# Patient Record
Sex: Female | Born: 1975
Health system: Southern US, Community
[De-identification: ages and names within clinical notes are randomized; demographics above are authoritative.]

## PROBLEM LIST (undated history)

## (undated) DIAGNOSIS — F603 Borderline personality disorder: Secondary | ICD-10-CM

## (undated) DIAGNOSIS — F410 Panic disorder [episodic paroxysmal anxiety] without agoraphobia: Secondary | ICD-10-CM

## (undated) DIAGNOSIS — F4310 Post-traumatic stress disorder, unspecified: Secondary | ICD-10-CM

## (undated) DIAGNOSIS — G43909 Migraine, unspecified, not intractable, without status migrainosus: Secondary | ICD-10-CM

## (undated) HISTORY — PX: TUBAL LIGATION: SHX77

---

## 2007-01-03 ENCOUNTER — Ambulatory Visit: Payer: Self-pay | Admitting: Family Medicine

## 2007-01-03 ENCOUNTER — Encounter (INDEPENDENT_AMBULATORY_CARE_PROVIDER_SITE_OTHER): Payer: Self-pay | Admitting: Family Medicine

## 2007-01-03 LAB — CONVERTED CEMR LAB
AST: 14 units/L (ref 0–37)
Alkaline Phosphatase: 49 units/L (ref 39–117)
Cholesterol: 161 mg/dL (ref 0–200)
HDL: 45 mg/dL (ref >39)
LDL Cholesterol: 101 mg/dL — ABNORMAL HIGH (ref 0–99)
MCHC: 33.3 g/dL (ref 30.0–36.0)
Platelets: 272 10*3/uL (ref 150–400)
Total CHOL/HDL Ratio: 3.6
Total Protein: 7.2 g/dL (ref 6.0–8.3)
Triglycerides: 74 mg/dL (ref <150)
VLDL: 15 mg/dL (ref 0–40)
WBC: 6.8 10*3/uL (ref 4.0–10.5)

## 2007-02-02 DIAGNOSIS — L2089 Other atopic dermatitis: Secondary | ICD-10-CM

## 2007-03-10 ENCOUNTER — Encounter (INDEPENDENT_AMBULATORY_CARE_PROVIDER_SITE_OTHER): Payer: Self-pay | Admitting: Family Medicine

## 2007-03-10 ENCOUNTER — Ambulatory Visit: Payer: Self-pay | Admitting: Family Medicine

## 2007-03-10 DIAGNOSIS — E669 Obesity, unspecified: Secondary | ICD-10-CM

## 2007-03-28 ENCOUNTER — Encounter (INDEPENDENT_AMBULATORY_CARE_PROVIDER_SITE_OTHER): Payer: Self-pay | Admitting: Family Medicine

## 2007-07-17 ENCOUNTER — Ambulatory Visit: Payer: Self-pay | Admitting: Emergency Medicine

## 2007-10-23 ENCOUNTER — Encounter: Payer: Self-pay | Admitting: Family Medicine

## 2007-10-23 ENCOUNTER — Ambulatory Visit: Payer: Self-pay | Admitting: Family Medicine

## 2007-10-23 DIAGNOSIS — G43009 Migraine without aura, not intractable, without status migrainosus: Secondary | ICD-10-CM | POA: Insufficient documentation

## 2007-10-24 LAB — CONVERTED CEMR LAB
ALT: 8 units/L (ref 0–35)
Albumin: 4.2 g/dL (ref 3.5–5.2)
Basophils Relative: 1 % (ref 0–1)
Creatinine, Ser: 0.81 mg/dL (ref 0.40–1.20)
Glucose, Bld: 84 mg/dL (ref 70–99)
HCT: 44.4 % (ref 36.0–46.0)
Lymphocytes Relative: 34 % (ref 12–46)
Lymphs Abs: 2 10*3/uL (ref 0.7–4.0)
Monocytes Relative: 12 % (ref 3–12)
Neutro Abs: 3 10*3/uL (ref 1.7–7.7)
Platelets: 245 10*3/uL (ref 150–400)
Sodium: 140 meq/L (ref 135–145)
Total Protein: 7 g/dL (ref 6.0–8.3)

## 2007-10-27 ENCOUNTER — Encounter: Admission: RE | Admit: 2007-10-27 | Discharge: 2007-10-27 | Payer: Self-pay | Admitting: Family Medicine

## 2007-10-31 ENCOUNTER — Encounter: Payer: Self-pay | Admitting: Family Medicine

## 2007-11-01 ENCOUNTER — Encounter (INDEPENDENT_AMBULATORY_CARE_PROVIDER_SITE_OTHER): Payer: Self-pay | Admitting: *Deleted

## 2007-11-14 ENCOUNTER — Encounter: Payer: Self-pay | Admitting: Family Medicine

## 2007-11-17 ENCOUNTER — Ambulatory Visit: Payer: Self-pay | Admitting: Family Medicine

## 2007-12-20 ENCOUNTER — Ambulatory Visit: Payer: Self-pay | Admitting: Internal Medicine

## 2012-04-18 ENCOUNTER — Other Ambulatory Visit (HOSPITAL_COMMUNITY): Payer: Self-pay | Admitting: Family Medicine

## 2012-04-18 DIAGNOSIS — Z139 Encounter for screening, unspecified: Secondary | ICD-10-CM

## 2012-04-18 DIAGNOSIS — Z01419 Encounter for gynecological examination (general) (routine) without abnormal findings: Secondary | ICD-10-CM

## 2012-04-27 ENCOUNTER — Ambulatory Visit (HOSPITAL_COMMUNITY): Payer: Self-pay

## 2012-05-04 ENCOUNTER — Ambulatory Visit (HOSPITAL_COMMUNITY): Payer: Self-pay

## 2012-12-27 ENCOUNTER — Encounter (HOSPITAL_COMMUNITY): Payer: Self-pay | Admitting: *Deleted

## 2012-12-27 ENCOUNTER — Emergency Department (HOSPITAL_COMMUNITY): Payer: Self-pay

## 2012-12-27 ENCOUNTER — Emergency Department (HOSPITAL_COMMUNITY)
Admission: EM | Admit: 2012-12-27 | Discharge: 2012-12-27 | Disposition: A | Discharge status: Home / Self Care | Payer: Self-pay | Attending: Emergency Medicine | Admitting: Emergency Medicine

## 2012-12-27 DIAGNOSIS — R062 Wheezing: Secondary | ICD-10-CM | POA: Insufficient documentation

## 2012-12-27 DIAGNOSIS — R509 Fever, unspecified: Secondary | ICD-10-CM | POA: Insufficient documentation

## 2012-12-27 DIAGNOSIS — J189 Pneumonia, unspecified organism: Secondary | ICD-10-CM

## 2012-12-27 DIAGNOSIS — R071 Chest pain on breathing: Secondary | ICD-10-CM | POA: Insufficient documentation

## 2012-12-27 MED ORDER — ALBUTEROL SULFATE (5 MG/ML) 0.5% IN NEBU
5.0000 mg | INHALATION_SOLUTION | Freq: Once | RESPIRATORY_TRACT | Status: AC
  Administered 2012-12-27: 5 mg via RESPIRATORY_TRACT
  Filled 2012-12-27: qty 1

## 2012-12-27 MED ORDER — AZITHROMYCIN 250 MG PO TABS: ORAL_TABLET | ORAL | Status: DC

## 2012-12-27 MED ORDER — AZITHROMYCIN 250 MG PO TABS
500.0000 mg | ORAL_TABLET | Freq: Once | ORAL | Status: AC
  Administered 2012-12-27: 500 mg via ORAL
  Filled 2012-12-27: qty 2

## 2012-12-27 MED ORDER — ALBUTEROL SULFATE HFA 108 (90 BASE) MCG/ACT IN AERS
2.0000 | INHALATION_SPRAY | RESPIRATORY_TRACT | Status: DC | PRN
  Administered 2012-12-27: 2 via RESPIRATORY_TRACT
  Filled 2012-12-27: qty 6.7

## 2012-12-27 NOTE — ED Provider Notes (Signed)
History   This chart was scribed for Sonya Lennert, MD by Charolett Bumpers, ED Scribe. The patient was seen in room [facility]11/[facility]. Patient's care was started at 1049.   CSN: [id number]  Arrival date & time 12/27/12  1040   First MD Initiated Contact with Patient 12/27/12 1049      Chief Complaint  Patient presents with  . Fever  . Cough   Sonya Trujillo is a 37 y.o. female who presents to the Emergency Department complaining of productive cough with white phlegm that started 3 days ago. She reports associated subjective fever, chills, wheezing and right upper chest pain. Temperature here in ED is 98.8. She denies any vomiting or diarrhea. She has not received her flu vaccination this year. She denies tobacco use.   Patient is a 37 y.o. female presenting with cough. The history is provided by the patient. No language interpreter was used.  Cough This is a new problem. The current episode started more than 2 days ago. The problem has been gradually worsening. The cough is productive of sputum. Associated symptoms include chest pain, chills and wheezing. Pertinent negatives include no headaches. She has tried nothing for the symptoms. She is not a smoker.    History reviewed. No pertinent past medical history.  Past Surgical History  Procedure Date  . Tubal ligation     No family history on file.  History  Substance Use Topics  . Smoking status: Never Smoker   . Smokeless tobacco: Not on file  . Alcohol Use: No    OB History    Grav Para Term Preterm Abortions TAB SAB Ect Mult Living                  Review of Systems  Constitutional: Positive for fever and chills. Negative for fatigue.  HENT: Negative for congestion, sinus pressure and ear discharge.   Eyes: Negative for discharge.  Respiratory: Positive for cough and wheezing.   Cardiovascular: Positive for chest pain.  Gastrointestinal: Negative for vomiting, abdominal pain and diarrhea.  Genitourinary:  Negative for frequency and hematuria.  Musculoskeletal: Negative for back pain.  Skin: Negative for rash.  Neurological: Negative for seizures and headaches.  Hematological: Negative.   Psychiatric/Behavioral: Negative for hallucinations.  All other systems reviewed and are negative.    Allergies  Review of patient's allergies indicates no known allergies.  Home Medications  No current outpatient prescriptions on file.  BP 126/75  Pulse 98  Temp 98.8 F (37.1 C) (Oral)  Resp 16  Ht 5\' 6"  (1.676 m)  Wt 250 lb (113.399 kg)  BMI 40.35 kg/m2  SpO2 98%  LMP 12/17/2012  Physical Exam  Nursing note and vitals reviewed. Constitutional: She is oriented to person, place, and time. She appears well-developed.  HENT:  Head: Normocephalic and atraumatic.  Eyes: Conjunctivae normal and EOM are normal. No scleral icterus.  Neck: Neck supple. No thyromegaly present.  Cardiovascular: Normal rate, regular rhythm and normal heart sounds.  Exam reveals no gallop and no friction rub.   No murmur heard. Pulmonary/Chest: Effort normal. No stridor. No respiratory distress. She has wheezes. She has no rales. She exhibits no tenderness.       Mild wheezing bilaterally noted.   Abdominal: Soft. She exhibits no distension. There is no tenderness. There is no rebound.  Musculoskeletal: Normal range of motion. She exhibits no edema.  Lymphadenopathy:    She has no cervical adenopathy.  Neurological: She is oriented to person, place, and  time. Coordination normal.  Skin: No rash noted. No erythema.  Psychiatric: She has a normal mood and affect. Her behavior is normal.    ED Course  Procedures (including critical care time)  DIAGNOSTIC STUDIES: Oxygen Saturation is 98% on room air, normal by my interpretation.    COORDINATION OF CARE:  10:58-Discussed planned course of treatment with the patient including a breathing treatment and a chest x-ray, who is agreeable at this time.    11:00-Medication Orders: Albuterol (Proventil) (5 mg/mL) 0.5% nebulizer solution 5 mg-once.   11:45-Recheck: Informed pt of imaging results. Pt will be d/c home who is agreeable at this time.   Labs Reviewed - No data to display Dg Chest 2 View  12/27/2012  *RADIOLOGY REPORT*  Clinical Data: Fever.  CHEST - 2 VIEW  Comparison: None.  Findings: Vague opacity in the right upper lobe concerning for early infiltrate/pneumonia.  Left lung is clear.  Heart is normal size.  No effusions.  No acute bony abnormality.  IMPRESSION: Vague right upper lobe airspace opacity concerning for pneumonia.   Original Report Authenticated By: Charlett Nose, M.D.      No diagnosis found.    MDM      The chart was scribed for me under my direct supervision.  I personally performed the history, physical, and medical decision making and all procedures in the evaluation of this patient.Sonya Lennert, MD 12/27/12 [phone]

## 2012-12-27 NOTE — ED Notes (Signed)
Pt states cough productive and white in color, wheezing, and discomfort to right upper chest. NAD.

## 2013-06-04 ENCOUNTER — Ambulatory Visit (HOSPITAL_COMMUNITY): Payer: Self-pay

## 2013-06-22 ENCOUNTER — Emergency Department (HOSPITAL_COMMUNITY)
Admission: EM | Admit: 2013-06-22 | Discharge: 2013-06-22 | Disposition: A | Discharge status: Home / Self Care | Payer: PRIVATE HEALTH INSURANCE | Attending: Emergency Medicine | Admitting: Emergency Medicine

## 2013-06-22 ENCOUNTER — Encounter (HOSPITAL_COMMUNITY): Payer: Self-pay | Admitting: *Deleted

## 2013-06-22 DIAGNOSIS — Z3202 Encounter for pregnancy test, result negative: Secondary | ICD-10-CM | POA: Insufficient documentation

## 2013-06-22 DIAGNOSIS — Z87891 Personal history of nicotine dependence: Secondary | ICD-10-CM | POA: Insufficient documentation

## 2013-06-22 DIAGNOSIS — N390 Urinary tract infection, site not specified: Secondary | ICD-10-CM | POA: Insufficient documentation

## 2013-06-22 DIAGNOSIS — R300 Dysuria: Secondary | ICD-10-CM | POA: Insufficient documentation

## 2013-06-22 DIAGNOSIS — R109 Unspecified abdominal pain: Secondary | ICD-10-CM | POA: Insufficient documentation

## 2013-06-22 LAB — URINALYSIS, ROUTINE W REFLEX MICROSCOPIC
Nitrite: NEGATIVE
Specific Gravity, Urine: >1.03 — ABNORMAL HIGH (ref 1.005–1.030)
Urobilinogen, UA: 0.2 mg/dL (ref 0.0–1.0)

## 2013-06-22 LAB — PREGNANCY, URINE: Preg Test, Ur: NEGATIVE

## 2013-06-22 LAB — URINE MICROSCOPIC-ADD ON

## 2013-06-22 MED ORDER — CEPHALEXIN 500 MG PO CAPS
500.0000 mg | ORAL_CAPSULE | Freq: Once | ORAL | Status: AC
  Administered 2013-06-22: 500 mg via ORAL
  Filled 2013-06-22: qty 1

## 2013-06-22 MED ORDER — ONDANSETRON HCL 4 MG PO TABS
4.0000 mg | ORAL_TABLET | Freq: Once | ORAL | Status: AC
  Administered 2013-06-22: 4 mg via ORAL
  Filled 2013-06-22: qty 1

## 2013-06-22 MED ORDER — PHENAZOPYRIDINE HCL 200 MG PO TABS: 200.0000 mg | ORAL_TABLET | Freq: Three times a day (TID) | ORAL | Status: DC

## 2013-06-22 MED ORDER — NITROFURANTOIN MONOHYD MACRO 100 MG PO CAPS: 100.0000 mg | ORAL_CAPSULE | Freq: Two times a day (BID) | ORAL | Status: DC

## 2013-06-22 MED ORDER — PHENAZOPYRIDINE HCL 100 MG PO TABS
100.0000 mg | ORAL_TABLET | Freq: Once | ORAL | Status: AC
  Administered 2013-06-22: 100 mg via ORAL
  Filled 2013-06-22: qty 1

## 2013-06-22 NOTE — ED Provider Notes (Addendum)
History    CSN: [id number] Arrival date & time 06/22/13  0725  First MD Initiated Contact with Patient 06/22/13 [phone]     Chief Complaint  Patient presents with  . Pelvic Pain  . Dysuria   (Consider location/radiation/quality/duration/timing/severity/associated sxs/prior Treatment) Patient is a 37 y.o. female presenting with pelvic pain and dysuria. The history is provided by the patient.  Pelvic Pain This is a new problem. The current episode started in the past 7 days. The problem occurs daily. The problem has been gradually worsening. Associated symptoms include abdominal pain and urinary symptoms. Pertinent negatives include no arthralgias, chest pain, coughing, fever or neck pain. Nothing aggravates the symptoms. She has tried nothing for the symptoms.  Dysuria Associated symptoms: abdominal pain   Associated symptoms: no fever    History reviewed. No pertinent past medical history. Past Surgical History  Procedure Laterality Date  . Tubal ligation     History reviewed. No pertinent family history. History  Substance Use Topics  . Smoking status: Former Games developer  . Smokeless tobacco: Not on file  . Alcohol Use: No   OB History   Grav Para Term Preterm Abortions TAB SAB Ect Mult Living   2 2             Review of Systems  Constitutional: Negative for fever and activity change.       All ROS Neg except as noted in HPI  HENT: Negative for nosebleeds and neck pain.   Eyes: Negative for photophobia and discharge.  Respiratory: Negative for cough, shortness of breath and wheezing.   Cardiovascular: Negative for chest pain and palpitations.  Gastrointestinal: Positive for abdominal pain. Negative for blood in stool.  Genitourinary: Positive for dysuria and pelvic pain. Negative for frequency and hematuria.  Musculoskeletal: Negative for back pain and arthralgias.  Skin: Negative.   Neurological: Negative for dizziness, seizures and speech difficulty.   Psychiatric/Behavioral: Negative for hallucinations and confusion.    Allergies  Review of patient's allergies indicates no known allergies.  Home Medications   Current Outpatient Rx  Name  Route  Sig  Dispense  Refill  . acetaminophen (TYLENOL) 500 MG tablet   Oral   Take 1,000 mg by mouth every 6 (six) hours as needed. Pain.         Marland Kitchen azithromycin (ZITHROMAX Z-PAK) 250 MG tablet      Take one a day.  Start tomorrow the 23rd   4 tablet   0   . ibuprofen (ADVIL,MOTRIN) 200 MG tablet   Oral   Take 800 mg by mouth every 6 (six) hours as needed. Pain.          BP 131/88  Pulse 74  Temp(Src) 99.1 F (37.3 C) (Oral)  Resp 17  Ht 5\' 7"  (1.702 m)  Wt 250 lb (113.399 kg)  BMI 39.15 kg/m2  SpO2 100%  LMP 06/05/2013 Physical Exam  Nursing note and vitals reviewed. Constitutional: She is oriented to person, place, and time. She appears well-developed and well-nourished.  Non-toxic appearance.  HENT:  Head: Normocephalic.  Right Ear: Tympanic membrane and external ear normal.  Left Ear: Tympanic membrane and external ear normal.  Eyes: EOM and lids are normal. Pupils are equal, round, and reactive to light.  Neck: Normal range of motion. Neck supple. Carotid bruit is not present.  Cardiovascular: Normal rate, regular rhythm, normal heart sounds, intact distal pulses and normal pulses.   Pulmonary/Chest: Breath sounds normal. No respiratory distress.  Abdominal: Soft. Bowel  sounds are normal. She exhibits no mass. There is no guarding.  Suprapubic and lower abd tenderness. No guarding. No CVAT.  Musculoskeletal: Normal range of motion.  Lymphadenopathy:       Head (right side): No submandibular adenopathy present.       Head (left side): No submandibular adenopathy present.    She has no cervical adenopathy.  Neurological: She is alert and oriented to person, place, and time. She has normal strength. No cranial nerve deficit or sensory deficit.  Skin: Skin is warm and  dry.  Psychiatric: She has a normal mood and affect. Her speech is normal.    ED Course  Procedures (including critical care time) Labs Reviewed  URINALYSIS, ROUTINE W REFLEX MICROSCOPIC  PREGNANCY, URINE   No results found. No diagnosis found.  MDM  **I have reviewed nursing notes, vital signs, and all appropriate lab and imaging results for this patient.* UA reveals a cloudy specimen with small leukocytes. Pregnancy test negative. 21-50 wbc'c. Pt treated Rx for pyridium and macrobid. Pt to have urine rechecked in 10 days. She will return to the ED sooner if any changes or problem.  Kathie Dike, PA-C 06/28/13 1420  Medical screening examination/treatment/procedure(s) were conducted as a shared visit with non-physician practitioner(s) and myself.  I personally evaluated the patient during the encounter.  No clinical evidence of pyelonephritis.  Donnetta Hutching, MD 07/16/13 4540  Donnetta Hutching, MD 07/26/13 [phone]

## 2013-06-22 NOTE — ED Notes (Signed)
Began having pelvic pain x 1 week ago.  Some dysuria, noticed urine is cloudy.  No vaginal discharge.

## 2013-06-22 NOTE — ED Notes (Signed)
States that she started having lower mid abdominal pain about 1 week ago, denies nausea or vomiting.  States that she did feel lightheaded last night.  States that her last period was about 1 week ago.  States that she had a tubal ligation in 2000.  Confirms burning with urination and urinary frequency.  Denies vaginal discharge.

## 2013-06-24 LAB — URINE CULTURE

## 2013-07-24 ENCOUNTER — Other Ambulatory Visit (HOSPITAL_COMMUNITY): Payer: Self-pay | Admitting: Family Medicine

## 2013-07-24 DIAGNOSIS — Z139 Encounter for screening, unspecified: Secondary | ICD-10-CM

## 2013-07-26 ENCOUNTER — Ambulatory Visit (HOSPITAL_COMMUNITY): Payer: PRIVATE HEALTH INSURANCE

## 2014-01-15 ENCOUNTER — Encounter (HOSPITAL_COMMUNITY): Payer: Self-pay | Admitting: Emergency Medicine

## 2014-01-15 ENCOUNTER — Emergency Department (HOSPITAL_COMMUNITY)
Admission: EM | Admit: 2014-01-15 | Discharge: 2014-01-15 | Disposition: A | Discharge status: Home / Self Care | Payer: PRIVATE HEALTH INSURANCE | Attending: Emergency Medicine | Admitting: Emergency Medicine

## 2014-01-15 DIAGNOSIS — Z87891 Personal history of nicotine dependence: Secondary | ICD-10-CM | POA: Insufficient documentation

## 2014-01-15 DIAGNOSIS — R51 Headache: Secondary | ICD-10-CM

## 2014-01-15 DIAGNOSIS — J3489 Other specified disorders of nose and nasal sinuses: Secondary | ICD-10-CM | POA: Insufficient documentation

## 2014-01-15 DIAGNOSIS — G43909 Migraine, unspecified, not intractable, without status migrainosus: Secondary | ICD-10-CM | POA: Insufficient documentation

## 2014-01-15 DIAGNOSIS — Z792 Long term (current) use of antibiotics: Secondary | ICD-10-CM | POA: Insufficient documentation

## 2014-01-15 DIAGNOSIS — R059 Cough, unspecified: Secondary | ICD-10-CM | POA: Insufficient documentation

## 2014-01-15 DIAGNOSIS — R05 Cough: Secondary | ICD-10-CM | POA: Insufficient documentation

## 2014-01-15 DIAGNOSIS — R509 Fever, unspecified: Secondary | ICD-10-CM | POA: Insufficient documentation

## 2014-01-15 DIAGNOSIS — Z79899 Other long term (current) drug therapy: Secondary | ICD-10-CM | POA: Insufficient documentation

## 2014-01-15 DIAGNOSIS — R519 Headache, unspecified: Secondary | ICD-10-CM

## 2014-01-15 MED ORDER — HYDROMORPHONE HCL PF 1 MG/ML IJ SOLN
1.0000 mg | Freq: Once | INTRAMUSCULAR | Status: AC
  Administered 2014-01-15: 1 mg via INTRAMUSCULAR
  Filled 2014-01-15: qty 1

## 2014-01-15 MED ORDER — OXYCODONE-ACETAMINOPHEN 5-325 MG PO TABS: 2.0000 | ORAL_TABLET | ORAL | Status: DC | PRN

## 2014-01-15 MED ORDER — ONDANSETRON 8 MG PO TBDP
8.0000 mg | ORAL_TABLET | Freq: Once | ORAL | Status: AC
  Administered 2014-01-15: 8 mg via ORAL
  Filled 2014-01-15: qty 1

## 2014-01-15 MED ORDER — PROMETHAZINE HCL 25 MG PO TABS: 25.0000 mg | ORAL_TABLET | Freq: Four times a day (QID) | ORAL | Status: DC | PRN

## 2014-01-15 NOTE — ED Provider Notes (Signed)
CSN: [id number]     Arrival date & time 01/15/14  1507 History   First MD Initiated Contact with Patient 01/15/14 1510     Chief Complaint  Patient presents with  . Migraine     (Consider location/radiation/quality/duration/timing/severity/associated sxs/prior Treatment) HPI HPI Comments: Sonya Trujillo is a 38 y.o. female who presents to the Emergency Department complaining of migraine for he past two day. Pt has chest congestion and cough that makes the pain worse. Pt has the associated symptom of nausea, dizziness, and photophobia. Denies VI. She has tried Excedrin Migraine (2-3 pills  Every four hours) which does not relieve her symptoms. She has a hx of frequent migraines and visited the ED twice Migraines in the past. She employed as a Conservation officer, naturecashier. History reviewed. No pertinent past medical history. Past Surgical History  Procedure Laterality Date  . Tubal ligation     Family History  Problem Relation Age of Onset  . Cancer Father   . Diabetes Father   . Stroke Other   . Cancer Other   . CAD Other    History  Substance Use Topics  . Smoking status: Former Smoker -- 0.45 packs/day for 6 years    Types: Cigarettes    Quit date: 12/07/2007  . Smokeless tobacco: Never Used  . Alcohol Use: No   OB History   Grav Para Term Preterm Abortions TAB SAB Ect Mult Living   2 2             Review of Systems  Constitutional: Positive for fever.  HENT: Positive for congestion.   Eyes: Positive for photophobia. Negative for visual disturbance.  Respiratory: Positive for cough.   Neurological: Positive for dizziness and headaches.  All other systems reviewed and are negative.      Allergies  Review of patient's allergies indicates no known allergies.  Home Medications   Current Outpatient Rx  Name  Route  Sig  Dispense  Refill  . acetaminophen (TYLENOL) 500 MG tablet   Oral   Take 1,000 mg by mouth every 6 (six) hours as needed. Pain.         Marland Kitchen. azithromycin (ZITHROMAX  Z-PAK) 250 MG tablet      Take one a day.  Start tomorrow the 23rd   4 tablet   0   . ibuprofen (ADVIL,MOTRIN) 200 MG tablet   Oral   Take 800 mg by mouth every 6 (six) hours as needed. Pain.         . nitrofurantoin, macrocrystal-monohydrate, (MACROBID) 100 MG capsule   Oral   Take 1 capsule (100 mg total) by mouth 2 (two) times daily.   14 capsule   0   . phenazopyridine (PYRIDIUM) 200 MG tablet   Oral   Take 1 tablet (200 mg total) by mouth 3 (three) times daily.   9 tablet   0     Please take after eating.    BP 131/69  Pulse 53  Temp(Src) 98.1 F (36.7 C) (Oral)  Resp 18  Ht 5\' 6"  (1.676 m)  Wt 242 lb (109.77 kg)  BMI 39.08 kg/m2  SpO2 96%  LMP 12/17/2013 Physical Exam  Nursing note and vitals reviewed. Constitutional: She is oriented to person, place, and time. She appears well-developed and well-nourished.  HENT:  Head: Normocephalic and atraumatic.  Eyes: Conjunctivae and EOM are normal. Pupils are equal, round, and reactive to light.  Neck: Normal range of motion. Neck supple.  Cardiovascular: Normal rate, regular rhythm and normal  heart sounds.   Pulmonary/Chest: Effort normal and breath sounds normal.  Abdominal: Soft. Bowel sounds are normal.  Musculoskeletal: Normal range of motion.  Neurological: She is alert and oriented to person, place, and time.  Skin: Skin is warm and dry.  Psychiatric: She has a normal mood and affect. Her behavior is normal.    ED Course  Procedures (including critical care time)  Labs Review Labs Reviewed - No data to display Imaging Review No results found.  EKG Interpretation   None       MDM   Final diagnoses:  None   I personally performed the services described in this documentation, which was scribed in my presence. The recorded information has been reviewed and is accurate.   History and physical consistent with uncomplicated migraine. No stiff neck or neurological deficits. Rx IM Dilaudid.  Discharge medications Percocet and Phenergan 25 mg.    Donnetta Hutching, MD 01/15/14 [phone]

## 2014-01-15 NOTE — ED Notes (Signed)
Patient c/o migraine x2 days with nausea. Patient reports sensitivity to light and sound with some dizziness. Denies any blurred vision. Per patient hx of migraines.  Patient also c/o chest congestion with cough and low grade fever. Per patient productive cough with thick white sputum.

## 2014-01-15 NOTE — ED Notes (Signed)
Pt alert & oriented x4, stable gait. Patient given discharge instructions, paperwork & prescription(s). Patient  instructed to stop at the registration desk to finish any additional paperwork. Patient verbalized understanding. Pt left department w/ no further questions. 

## 2014-01-15 NOTE — Discharge Instructions (Signed)
Medication for pain and nausea. Increase fluids. Rest

## 2014-01-28 ENCOUNTER — Emergency Department (HOSPITAL_COMMUNITY)
Admission: EM | Admit: 2014-01-28 | Discharge: 2014-01-28 | Disposition: A | Discharge status: Home / Self Care | Payer: PRIVATE HEALTH INSURANCE | Attending: Emergency Medicine | Admitting: Emergency Medicine

## 2014-01-28 ENCOUNTER — Encounter (HOSPITAL_COMMUNITY): Payer: Self-pay | Admitting: Emergency Medicine

## 2014-01-28 DIAGNOSIS — Z8679 Personal history of other diseases of the circulatory system: Secondary | ICD-10-CM | POA: Insufficient documentation

## 2014-01-28 DIAGNOSIS — H53149 Visual discomfort, unspecified: Secondary | ICD-10-CM | POA: Insufficient documentation

## 2014-01-28 DIAGNOSIS — R112 Nausea with vomiting, unspecified: Secondary | ICD-10-CM | POA: Insufficient documentation

## 2014-01-28 DIAGNOSIS — R51 Headache: Secondary | ICD-10-CM

## 2014-01-28 DIAGNOSIS — Z79899 Other long term (current) drug therapy: Secondary | ICD-10-CM | POA: Insufficient documentation

## 2014-01-28 DIAGNOSIS — H538 Other visual disturbances: Secondary | ICD-10-CM | POA: Insufficient documentation

## 2014-01-28 DIAGNOSIS — R519 Headache, unspecified: Secondary | ICD-10-CM

## 2014-01-28 DIAGNOSIS — Z87891 Personal history of nicotine dependence: Secondary | ICD-10-CM | POA: Insufficient documentation

## 2014-01-28 DIAGNOSIS — R42 Dizziness and giddiness: Secondary | ICD-10-CM | POA: Insufficient documentation

## 2014-01-28 DIAGNOSIS — G43909 Migraine, unspecified, not intractable, without status migrainosus: Secondary | ICD-10-CM | POA: Insufficient documentation

## 2014-01-28 HISTORY — DX: Migraine, unspecified, not intractable, without status migrainosus: G43.909

## 2014-01-28 MED ORDER — METOCLOPRAMIDE HCL 5 MG/ML IJ SOLN
10.0000 mg | Freq: Once | INTRAMUSCULAR | Status: AC
  Administered 2014-01-28: 10 mg via INTRAVENOUS
  Filled 2014-01-28: qty 2

## 2014-01-28 MED ORDER — DIPHENHYDRAMINE HCL 50 MG/ML IJ SOLN
50.0000 mg | Freq: Once | INTRAMUSCULAR | Status: AC
  Administered 2014-01-28: 50 mg via INTRAVENOUS
  Filled 2014-01-28: qty 1

## 2014-01-28 NOTE — Discharge Instructions (Signed)

## 2014-01-28 NOTE — ED Provider Notes (Signed)
CSN: [id number]     Arrival date & time 01/28/14  1605 History   First MD Initiated Contact with Patient 01/28/14 1942     Chief Complaint  Patient presents with  . Migraine      Patient is a 38 y.o. female presenting with headaches. The history is provided by the patient.  Headache Pain location:  Generalized Radiates to:  Does not radiate Onset quality:  Gradual Duration:  1 day Timing:  Constant Progression:  Worsening Chronicity:  Recurrent Similar to prior headaches: yes   Relieved by:  Nothing Worsened by:  Light Associated symptoms: blurred vision, nausea, photophobia and vomiting   Associated symptoms: no abdominal pain, no fever and no focal weakness   pt reports gradual onset of HA over past day Similar to prior headaches No head trauma No focal weakness No fever She does report vomiting and she reports dizziness and blurred vision but no visual loss Reports long h/o migraines with recent increase in headaches (seen recently in the ED)   Past Medical History  Diagnosis Date  . Migraine    Past Surgical History  Procedure Laterality Date  . Tubal ligation     Family History  Problem Relation Age of Onset  . Cancer Father   . Diabetes Father   . Stroke Other   . Cancer Other   . CAD Other    History  Substance Use Topics  . Smoking status: Former Smoker -- 0.45 packs/day for 6 years    Types: Cigarettes    Quit date: 12/07/2007  . Smokeless tobacco: Never Used  . Alcohol Use: No   OB History   Grav Para Term Preterm Abortions TAB SAB Ect Mult Living   2 2             Review of Systems  Constitutional: Negative for fever.  Eyes: Positive for blurred vision and photophobia.  Gastrointestinal: Positive for nausea and vomiting. Negative for abdominal pain.  Neurological: Positive for headaches. Negative for focal weakness.  All other systems reviewed and are negative.      Allergies  Review of patient's allergies indicates no known  allergies.  Home Medications   Current Outpatient Rx  Name  Route  Sig  Dispense  Refill  . acetaminophen (TYLENOL) 500 MG tablet   Oral   Take 1,000 mg by mouth every 6 (six) hours as needed. Pain.         Marland Kitchen. ibuprofen (ADVIL,MOTRIN) 200 MG tablet   Oral   Take 800 mg by mouth every 6 (six) hours as needed. Pain.         Marland Kitchen. oxyCODONE-acetaminophen (PERCOCET) 5-325 MG per tablet   Oral   Take 2 tablets by mouth every 4 (four) hours as needed.   15 tablet   0   . phentermine (ADIPEX-P) 37.5 MG tablet   Oral   Take 37.5 mg by mouth daily before breakfast.         . promethazine (PHENERGAN) 25 MG tablet   Oral   Take 1 tablet (25 mg total) by mouth every 6 (six) hours as needed for nausea or vomiting.   15 tablet   0    BP 101/54  Pulse 64  Temp(Src) 98.3 F (36.8 C) (Oral)  Resp 16  Ht 5\' 6"  (1.676 m)  Wt 242 lb (109.77 kg)  BMI 39.08 kg/m2  SpO2 100%  LMP 01/18/2014 Physical Exam CONSTITUTIONAL: Well developed/well nourished HEAD: Normocephalic/atraumatic EYES: EOMI/PERRL, no nystagmus, normal fundoscopic  exam  ENMT: Mucous membranes moist NECK: supple no meningeal signs, no bruits SPINE:entire spine nontender CV: S1/S2 noted, no murmurs/rubs/gallops noted LUNGS: Lungs are clear to auscultation bilaterally, no apparent distress ABDOMEN: soft, nontender, no rebound or guarding GU:no cva tenderness NEURO:Awake/alert, facies symmetric, no arm or leg drift is noted Cranial nerves 3/4/5/6/06/13/09/11/12 tested and intact Gait normal without ataxia No past pointing EXTREMITIES: pulses normal, full ROM SKIN: warm, color normal PSYCH: no abnormalities of mood noted   ED Course  Procedures   Pt well appearing, no distress, using phone on my reassessment and is stable for d//c home I advised f/u with [facility] We discussed strict return precautions  MDM   Final diagnoses:  Headache    Nursing notes including past medical history and  social history reviewed and considered in documentation Previous records reviewed and considered     Joya Gaskins, MD 01/28/14 2244

## 2014-01-28 NOTE — ED Notes (Signed)
Pt c/o migraine with n/v. Pt c/o dizziness today.

## 2014-10-07 ENCOUNTER — Encounter (HOSPITAL_COMMUNITY): Payer: Self-pay | Admitting: Emergency Medicine

## 2014-12-04 ENCOUNTER — Emergency Department: Payer: Self-pay | Admitting: Emergency Medicine

## 2014-12-04 LAB — COMPREHENSIVE METABOLIC PANEL
ALK PHOS: 48 U/L
ANION GAP: 5 — AB (ref 7–16)
AST: 20 U/L (ref 15–37)
Albumin: 3.4 g/dL (ref 3.4–5.0)
BILIRUBIN TOTAL: 0.6 mg/dL (ref 0.2–1.0)
BUN: 14 mg/dL (ref 7–18)
CHLORIDE: 107 mmol/L (ref 98–107)
CO2: 27 mmol/L (ref 21–32)
CREATININE: 0.85 mg/dL (ref 0.60–1.30)
Calcium, Total: 8.4 mg/dL — ABNORMAL LOW (ref 8.5–10.1)
EGFR (African American): >60
EGFR (Non-African Amer.): >60
GLUCOSE: 89 mg/dL (ref 65–99)
OSMOLALITY: 277 (ref 275–301)
Potassium: 4.1 mmol/L (ref 3.5–5.1)
SGPT (ALT): 34 U/L
SODIUM: 139 mmol/L (ref 136–145)
TOTAL PROTEIN: 7.1 g/dL (ref 6.4–8.2)

## 2014-12-04 LAB — URINALYSIS, COMPLETE
BACTERIA: NONE SEEN
Bilirubin,UR: NEGATIVE
Glucose,UR: NEGATIVE mg/dL (ref 0–75)
KETONE: NEGATIVE
LEUKOCYTE ESTERASE: NEGATIVE
Nitrite: NEGATIVE
PROTEIN: NEGATIVE
Ph: 7 (ref 4.5–8.0)
RBC,UR: <1 /HPF (ref 0–5)
SPECIFIC GRAVITY: 1.006 (ref 1.003–1.030)
Squamous Epithelial: <1
WBC UR: NONE SEEN /HPF (ref 0–5)

## 2014-12-04 LAB — CBC WITH DIFFERENTIAL/PLATELET
Basophil #: 0.1 10*3/uL (ref 0.0–0.1)
Basophil %: 1.7 %
EOS PCT: 3.5 %
Eosinophil #: 0.2 10*3/uL (ref 0.0–0.7)
HCT: 42.9 % (ref 35.0–47.0)
HGB: 14 g/dL (ref 12.0–16.0)
LYMPHS PCT: 28.8 %
Lymphocyte #: 2 10*3/uL (ref 1.0–3.6)
MCH: 30.6 pg (ref 26.0–34.0)
MCHC: 32.7 g/dL (ref 32.0–36.0)
MCV: 94 fL (ref 80–100)
MONOS PCT: 10.3 %
Monocyte #: 0.7 x10 3/mm (ref 0.2–0.9)
NEUTROS PCT: 55.7 %
Neutrophil #: 4 10*3/uL (ref 1.4–6.5)
PLATELETS: 210 10*3/uL (ref 150–440)
RBC: 4.58 10*6/uL (ref 3.80–5.20)
RDW: 13.4 % (ref 11.5–14.5)
WBC: 7.1 10*3/uL (ref 3.6–11.0)

## 2014-12-04 LAB — LIPASE, BLOOD: Lipase: 100 U/L (ref 73–393)

## 2014-12-04 LAB — WET PREP, GENITAL

## 2014-12-04 LAB — GC/CHLAMYDIA PROBE AMP

## 2015-10-10 ENCOUNTER — Ambulatory Visit: Payer: Self-pay | Admitting: Family Medicine

## 2016-06-15 ENCOUNTER — Encounter: Payer: Self-pay | Admitting: Emergency Medicine

## 2016-06-15 ENCOUNTER — Emergency Department
Admission: EM | Admit: 2016-06-15 | Discharge: 2016-06-15 | Disposition: A | Discharge status: Home / Self Care | Payer: Self-pay | Attending: Emergency Medicine | Admitting: Emergency Medicine

## 2016-06-15 DIAGNOSIS — Z791 Long term (current) use of non-steroidal anti-inflammatories (NSAID): Secondary | ICD-10-CM | POA: Insufficient documentation

## 2016-06-15 DIAGNOSIS — Z79899 Other long term (current) drug therapy: Secondary | ICD-10-CM | POA: Insufficient documentation

## 2016-06-15 DIAGNOSIS — G43009 Migraine without aura, not intractable, without status migrainosus: Secondary | ICD-10-CM

## 2016-06-15 DIAGNOSIS — F410 Panic disorder [episodic paroxysmal anxiety] without agoraphobia: Secondary | ICD-10-CM | POA: Insufficient documentation

## 2016-06-15 DIAGNOSIS — Z87891 Personal history of nicotine dependence: Secondary | ICD-10-CM | POA: Insufficient documentation

## 2016-06-15 DIAGNOSIS — Z79891 Long term (current) use of opiate analgesic: Secondary | ICD-10-CM | POA: Insufficient documentation

## 2016-06-15 MED ORDER — BUTALBITAL-APAP-CAFFEINE 50-325-40 MG PO TABS
2.0000 | ORAL_TABLET | Freq: Once | ORAL | Status: AC
  Administered 2016-06-15: 2 via ORAL
  Filled 2016-06-15: qty 2

## 2016-06-15 MED ORDER — DIPHENHYDRAMINE HCL 50 MG/ML IJ SOLN
25.0000 mg | Freq: Once | INTRAMUSCULAR | Status: AC
  Administered 2016-06-15: 25 mg via INTRAVENOUS
  Filled 2016-06-15: qty 1

## 2016-06-15 MED ORDER — BUTALBITAL-APAP-CAFFEINE 50-325-40 MG PO TABS: 1.0000 | ORAL_TABLET | Freq: Four times a day (QID) | ORAL | Status: DC | PRN

## 2016-06-15 MED ORDER — SODIUM CHLORIDE 0.9 % IV BOLUS (SEPSIS)
1000.0000 mL | Freq: Once | INTRAVENOUS | Status: AC
  Administered 2016-06-15: 1000 mL via INTRAVENOUS

## 2016-06-15 MED ORDER — METOCLOPRAMIDE HCL 5 MG/ML IJ SOLN
10.0000 mg | Freq: Once | INTRAMUSCULAR | Status: AC
  Administered 2016-06-15: 10 mg via INTRAVENOUS
  Filled 2016-06-15: qty 2

## 2016-06-15 MED ORDER — KETOROLAC TROMETHAMINE 30 MG/ML IJ SOLN
30.0000 mg | Freq: Once | INTRAMUSCULAR | Status: AC
  Administered 2016-06-15: 30 mg via INTRAVENOUS
  Filled 2016-06-15: qty 1

## 2016-06-15 NOTE — ED Provider Notes (Signed)
[facility] Emergency Department Provider Note   ____________________________________________  Time seen: Approximately 6:25 AM  I have reviewed the triage vital signs and the nursing notes.   HISTORY  Chief Complaint Migraine    HPI Norvella Loscalzo is a 40 y.o. female who comes into the hospital today with a migraine and panic attack. The patient reports that she woke up around midnight having a migraine. She took over-the-counter migraine medicine but reports that it did not help. The patient reports this feels like her previous migraines. The panic attack symptoms started right after the migraine. She is feeling shaky with some chest tightness. She did have some arm numbness but that has resolved. She reports that she just feels really panicky. The patient is on fluoxetine but has no other medications for her anxiety. The patient goes[facility] has an appointment at the end of August. The patient rates her pain at 8 out of 10 in intensity. She has some nausea with no vomiting, she has some sensitivity to light and sound.   Past Medical History  Diagnosis Date  . Migraine     Patient Active Problem List   Diagnosis Date Noted  . COMMON MIGRAINE 10/23/2007  . OBESITY NOS 03/10/2007  . ECZEMA, ATOPIC DERMATITIS 02/02/2007    Past Surgical History  Procedure Laterality Date  . Tubal ligation      Current Outpatient Rx  Name  Route  Sig  Dispense  Refill  . acetaminophen (TYLENOL) 500 MG tablet   Oral   Take 1,000 mg by mouth every 6 (six) hours as needed. Pain.         . butalbital-acetaminophen-caffeine (FIORICET) 50-325-40 MG tablet   Oral   Take 1-2 tablets by mouth every 6 (six) hours as needed for headache.   20 tablet   0   . ibuprofen (ADVIL,MOTRIN) 200 MG tablet   Oral   Take 800 mg by mouth every 6 (six) hours as needed. Pain.         Marland Kitchen oxyCODONE-acetaminophen (PERCOCET) 5-325 MG per tablet   Oral   Take 2 tablets by  mouth every 4 (four) hours as needed.   15 tablet   0   . phentermine (ADIPEX-P) 37.5 MG tablet   Oral   Take 37.5 mg by mouth daily before breakfast.         . promethazine (PHENERGAN) 25 MG tablet   Oral   Take 1 tablet (25 mg total) by mouth every 6 (six) hours as needed for nausea or vomiting.   15 tablet   0     Allergies Review of patient's allergies indicates no known allergies.  Family History  Problem Relation Age of Onset  . Cancer Father   . Diabetes Father   . Stroke Other   . Cancer Other   . CAD Other     Social History Social History  Substance Use Topics  . Smoking status: Former Smoker -- 0.45 packs/day for 6 years    Types: Cigarettes    Quit date: 12/07/2007  . Smokeless tobacco: Never Used  . Alcohol Use: No    Review of Systems Constitutional: No fever/chills Eyes: No visual changes. ENT: No sore throat. Cardiovascular: Chest tightness Respiratory: Denies shortness of breath. Gastrointestinal:  nausea, no vomiting.  No diarrhea.  No constipation. Genitourinary: Negative for dysuria. Musculoskeletal: Negative for back pain. Skin: Negative for rash. Neurological: Migraine headache Psych: Anxiety  10-point ROS otherwise negative.  ____________________________________________   PHYSICAL  EXAM:  VITAL SIGNS: ED Triage Vitals  Enc Vitals Group     BP 06/15/16 0607 155/96 mmHg     Pulse Rate 06/15/16 0607 56     Resp 06/15/16 0607 18     Temp 06/15/16 0607 98.1 F (36.7 C)     Temp Source 06/15/16 0607 Oral     SpO2 06/15/16 0607 99 %     Weight 06/15/16 0605 258 lb (117.028 kg)     Height 06/15/16 0605 5\' 6"  (1.676 m)     Head Cir --      Peak Flow --      Pain Score 06/15/16 0606 8     Pain Loc --      Pain Edu? --      Excl. in GC? --     Constitutional: Alert and oriented. Well appearing and in Mild distress. Eyes: Conjunctivae are normal. PERRL. EOMI. Head: Atraumatic. Nose: No congestion/rhinnorhea. Mouth/Throat:  Mucous membranes are moist.  Oropharynx non-erythematous. Cardiovascular: Normal rate, regular rhythm. Grossly normal heart sounds.  Good peripheral circulation. Respiratory: Normal respiratory effort.  No retractions. Lungs CTAB. Gastrointestinal: Soft and nontender. No distention. Positive bowel sounds Musculoskeletal: No lower extremity tenderness nor edema.   Neurologic:  Normal speech and language. Cranial nerves II through XII are grossly intact with no focal motor or neuro deficits Skin:  Skin is warm, dry and intact.  Psychiatric: Mood and affect are normal.   ____________________________________________   LABS (all labs ordered are listed, but only abnormal results are displayed)  Labs Reviewed - No data to display ____________________________________________  EKG  None ____________________________________________  RADIOLOGY  None ____________________________________________   PROCEDURES  Procedure(s) performed: None  Procedures  Critical Care performed: No  ____________________________________________   INITIAL IMPRESSION / ASSESSMENT AND PLAN / ED COURSE  Pertinent labs & imaging results that were available during my care of the patient were reviewed by me and considered in my medical decision making (see chart for details).  This is a 40 year old female who has a history of migraines and panic attacks who comes into the hospital today with a migraine and a panic attack. I will give the patient a dose of Reglan, Benadryl, Toradol and a liter of normal saline. I will reassess her when she's receive her medications. The patient did have some chest tightness and shakiness but she reports that that is also typical of her panic attacks. When she's receive her migraine medicine I will determine if she needs any further medicine for her panic attack.  The patient's headache is improved down to a 5. I will give her 2 Fioricet. The patient's care was signed out to Dr.  Cyril LoosenKinner who will follow-up the patient's headache and disposition the patient. ____________________________________________   FINAL CLINICAL IMPRESSION(S) / ED DIAGNOSES  Final diagnoses:  Nonintractable migraine, unspecified migraine type  Panic attack      NEW MEDICATIONS STARTED DURING THIS VISIT:  New Prescriptions   BUTALBITAL-ACETAMINOPHEN-CAFFEINE (FIORICET) 50-325-40 MG TABLET    Take 1-2 tablets by mouth every 6 (six) hours as needed for headache.     Note:  This document was prepared using Dragon voice recognition software and may include unintentional dictation errors.    Rebecka ApleyAllison P Webster, MD 06/15/16 [phone]

## 2016-06-15 NOTE — ED Notes (Signed)
Pt alert and oriented X4, active, cooperative, pt in NAD. RR even and unlabored, color WNL.  Pt informed to return if any life threatening symptoms occur.   

## 2016-06-15 NOTE — ED Notes (Signed)
Pt arrived to the ED complaining of having a headache. Pt states that she suffers of migraines and thi feels like it. Pt is AOx4 in no apparent distress.

## 2016-06-15 NOTE — Discharge Instructions (Signed)
Migraine Headache A migraine headache is an intense, throbbing pain on one or both sides of your head. A migraine can last for 30 minutes to several hours. CAUSES  The exact cause of a migraine headache is not always known. However, a migraine may be caused when nerves in the brain become irritated and release chemicals that cause inflammation. This causes pain. Certain things may also trigger migraines, such as:  Alcohol.  Smoking.  Stress.  Menstruation.  Aged cheeses.  Foods or drinks that contain nitrates, glutamate, aspartame, or tyramine.  Lack of sleep.  Chocolate.  Caffeine.  Hunger.  Physical exertion.  Fatigue.  Medicines used to treat chest pain (nitroglycerine), birth control pills, estrogen, and some blood pressure medicines. SIGNS AND SYMPTOMS  Pain on one or both sides of your head.  Pulsating or throbbing pain.  Severe pain that prevents daily activities.  Pain that is aggravated by any physical activity.  Nausea, vomiting, or both.  Dizziness.  Pain with exposure to bright lights, loud noises, or activity.  General sensitivity to bright lights, loud noises, or smells. Before you get a migraine, you may get warning signs that a migraine is coming (aura). An aura may include:  Seeing flashing lights.  Seeing bright spots, halos, or zigzag lines.  Having tunnel vision or blurred vision.  Having feelings of numbness or tingling.  Having trouble talking.  Having muscle weakness. DIAGNOSIS  A migraine headache is often diagnosed based on:  Symptoms.  Physical exam.  A CT scan or MRI of your head. These imaging tests cannot diagnose migraines, but they can help rule out other causes of headaches. TREATMENT Medicines may be given for pain and nausea. Medicines can also be given to help prevent recurrent migraines.  HOME CARE INSTRUCTIONS  Only take over-the-counter or prescription medicines for pain or discomfort as directed by your  health care provider. The use of long-term narcotics is not recommended.  Lie down in a dark, quiet room when you have a migraine.  Keep a journal to find out what may trigger your migraine headaches. For example, write down:  What you eat and drink.  How much sleep you get.  Any change to your diet or medicines.  Limit alcohol consumption.  Quit smoking if you smoke.  Get 7-9 hours of sleep, or as recommended by your health care provider.  Limit stress.  Keep lights dim if bright lights bother you and make your migraines worse. SEEK IMMEDIATE MEDICAL CARE IF:   Your migraine becomes severe.  You have a fever.  You have a stiff neck.  You have vision loss.  You have muscular weakness or loss of muscle control.  You start losing your balance or have trouble walking.  You feel faint or pass out.  You have severe symptoms that are different from your first symptoms. MAKE SURE YOU:   Understand these instructions.  Will watch your condition.  Will get help right away if you are not doing well or get worse.   This information is not intended to replace advice given to you by your health care provider. Make sure you discuss any questions you have with your health care provider.   Document Released: 11/22/2005 Document Revised: 12/13/2014 Document Reviewed: 07/30/2013 Elsevier Interactive Patient Education 2016 Elsevier Inc.  Panic Attacks Panic attacks are sudden, short-livedsurges of severe anxiety, fear, or discomfort. They may occur for no reason when you are relaxed, when you are anxious, or when you are sleeping. Panic attacks  may occur for a number of reasons:   Healthy people occasionally have panic attacks in extreme, life-threatening situations, such as war or natural disasters. Normal anxiety is a protective mechanism of the body that helps us react to danger (fight or flight response).  Panic attacks are often seen with anxiety disorders, such as panic  disorder, social anxiety disorder, generalized anxiety disorder, and phobias. Anxiety disorders cause excessive or uncontrollable anxiety. They may interfere with your relationships or other life activities.  Panic attacks are sometimes seen with other mental illnesses, such as depression and posttraumatic stress disorder.  Certain medical conditions, prescription medicines, and drugs of abuse can cause panic attacks. SYMPTOMS  Panic attacks start suddenly, peak within 20 minutes, and are accompanied by four or more of the following symptoms:  Pounding heart or fast heart rate (palpitations).  Sweating.  Trembling or shaking.  Shortness of breath or feeling smothered.  Feeling choked.  Chest pain or discomfort.  Nausea or strange feeling in your stomach.  Dizziness, light-headedness, or feeling like you will faint.  Chills or hot flushes.  Numbness or tingling in your lips or hands and feet.  Feeling that things are not real or feeling that you are not yourself.  Fear of losing control or going crazy.  Fear of dying. Some of these symptoms can mimic serious medical conditions. For example, you may think you are having a heart attack. Although panic attacks can be very scary, they are not life threatening. DIAGNOSIS  Panic attacks are diagnosed through an assessment by your health care provider. Your health care provider will ask questions about your symptoms, such as where and when they occurred. Your health care provider will also ask about your medical history and use of alcohol and drugs, including prescription medicines. Your health care provider may order blood tests or other studies to rule out a serious medical condition. Your health care provider may refer you to a mental health professional for further evaluation. TREATMENT   Most healthy people who have one or two panic attacks in an extreme, life-threatening situation will not require treatment.  The treatment for  panic attacks associated with anxiety disorders or other mental illness typically involves counseling with a mental health professional, medicine, or a combination of both. Your health care provider will help determine what treatment is best for you.  Panic attacks due to physical illness usually go away with treatment of the illness. If prescription medicine is causing panic attacks, talk with your health care provider about stopping the medicine, decreasing the dose, or substituting another medicine.  Panic attacks due to alcohol or drug abuse go away with abstinence. Some adults need professional help in order to stop drinking or using drugs. HOME CARE INSTRUCTIONS   Take all medicines as directed by your health care provider.   Schedule and attend follow-up visits as directed by your health care provider. It is important to keep all your appointments. SEEK MEDICAL CARE IF:  You are not able to take your medicines as prescribed.  Your symptoms do not improve or get worse. SEEK IMMEDIATE MEDICAL CARE IF:   You experience panic attack symptoms that are different than your usual symptoms.  You have serious thoughts about hurting yourself or others.  You are taking medicine for panic attacks and have a serious side effect. MAKE SURE YOU:  Understand these instructions.  Will watch your condition.  Will get help right away if you are not doing well or get worse.  This information is not intended to replace advice given to you by your health care provider. Make sure you discuss any questions you have with your health care provider.   Document Released: 11/22/2005 Document Revised: 11/27/2013 Document Reviewed: 07/06/2013 Elsevier Interactive Patient Education [employer] Elsevier Inc.

## 2016-06-15 NOTE — ED Notes (Addendum)
Pt presents with "migraine and panic attacks". States migraine for 6-7 hours, woke up with it, is on R side of head, "sometimes travels to the left but mainly stays on the right side." States pain is on front at forehead. Pt states took OTC Excedrin migraine. States blurred vision, lights, movement, and sounds bother her, and nausea. No distress noted, no difficulty breathing.  Pt states for panic attacks- "shaking, chest is tight, dizzy, this arm is numb earlier but not right now (and points to R arm)." Hx of PTSD and depression.

## 2016-09-21 ENCOUNTER — Ambulatory Visit: Payer: Self-pay | Admitting: Pharmacy Technician

## 2016-10-19 ENCOUNTER — Ambulatory Visit: Payer: Self-pay

## 2016-10-25 ENCOUNTER — Ambulatory Visit: Payer: Self-pay | Admitting: Pharmacy Technician

## 2016-10-25 DIAGNOSIS — Z79899 Other long term (current) drug therapy: Secondary | ICD-10-CM

## 2016-10-25 NOTE — Progress Notes (Signed)
Completed [facility] application and contract.  Patient agreed to all terms of the [facility] contract.  Patient to provide notarized letter of support.  [facility] and [facility].  Also, provided patient with other community resource material based on her particular needs.    Sherilyn DacostaBetty J. Avalene Sealy Care Manager [facility]

## 2016-12-07 ENCOUNTER — Telehealth: Payer: Self-pay | Admitting: Pharmacy Technician

## 2016-12-07 NOTE — Telephone Encounter (Signed)
Patient failed to provide current utility bill and notarized letter of support.  No additional medication assistance will be provided by [facility] without the required proof of income documentation.  Patient notified by letter.  Sherilyn DacostaBetty J. Tenille Morrill Care Manager [facility]

## 2017-06-01 ENCOUNTER — Encounter (HOSPITAL_COMMUNITY): Payer: Self-pay | Admitting: *Deleted

## 2017-06-01 ENCOUNTER — Emergency Department (HOSPITAL_COMMUNITY)
Admission: EM | Admit: 2017-06-01 | Discharge: 2017-06-01 | Disposition: A | Discharge status: Home / Self Care | Payer: Self-pay | Attending: Emergency Medicine | Admitting: Emergency Medicine

## 2017-06-01 ENCOUNTER — Emergency Department (HOSPITAL_COMMUNITY): Payer: Self-pay

## 2017-06-01 DIAGNOSIS — R05 Cough: Secondary | ICD-10-CM | POA: Insufficient documentation

## 2017-06-01 DIAGNOSIS — Z87891 Personal history of nicotine dependence: Secondary | ICD-10-CM | POA: Insufficient documentation

## 2017-06-01 DIAGNOSIS — Z79899 Other long term (current) drug therapy: Secondary | ICD-10-CM | POA: Insufficient documentation

## 2017-06-01 DIAGNOSIS — R059 Cough, unspecified: Secondary | ICD-10-CM

## 2017-06-01 NOTE — Discharge Instructions (Signed)
Please call the number listed in the black box on this paperwork to obtain a primary care provider.   Please take Ibuprofen (Advil, motrin) and Tylenol (acetaminophen) to relieve your pain.  You may take up to 800 MG (4 pills) of normal strength ibuprofen every 8 hours as needed.  In between doses of ibuprofen you make take tylenol, up to 1,000 mg (two extra strength pills).  Do not take more than 3,000 mg tylenol in a 24 hour period.  Please check all medication labels as many medications such as pain and cold medications may contain tylenol.  Do not drink alcohol while taking these medications.  Do not take other NSAID'S while taking ibuprofen (such as aleve or naproxen).  Please take ibuprofen with food to decrease stomach upset.  Please consider taking a daily allergy medication to help with your symptoms.  I suggest a less drowsy 24 hour medication such as allegra, zyrtec or Claritin or the generic version.    Please seek additional medical care if your symptoms worsen or fail to improve.

## 2017-06-01 NOTE — ED Provider Notes (Signed)
[facility]-EMERGENCY DEPT Provider Note   CSN: [id number] Arrival date & time: 06/01/17  4098     History   Chief Complaint Chief Complaint  Patient presents with  . Cough    HPI Sonya Trujillo is a 41 y.o. female who presents with a one week history of cough.  She reports that she gets bronchitis Normally every year and that this feels exactly the same as when she has been previously diagnosed with bronchitis. She reports that she has a sore throat that began 2 days ago.  She states that she feels like she has "stuff come up into my throat" when she coughs but is unable to spit any of it out. No fevers or chills.   HPI  Past Medical History:  Diagnosis Date  . Migraine     Patient Active Problem List   Diagnosis Date Noted  . COMMON MIGRAINE 10/23/2007  . OBESITY NOS 03/10/2007  . ECZEMA, ATOPIC DERMATITIS 02/02/2007    Past Surgical History:  Procedure Laterality Date  . TUBAL LIGATION      OB History    Gravida Para Term Preterm AB Living   2 2           SAB TAB Ectopic Multiple Live Births                   Home Medications    Prior to Admission medications   Medication Sig Start Date End Date Taking? Authorizing Provider  FLUoxetine (PROZAC) 20 MG tablet Take 60 mg by mouth daily.   Yes [provider]  ibuprofen (ADVIL,MOTRIN) 200 MG tablet Take 800 mg by mouth every 6 (six) hours as needed. Pain.   Yes [provider]    Family History Family History  Problem Relation Age of Onset  . Cancer Father   . Diabetes Father   . Stroke Other   . Cancer Other   . CAD Other     Social History Social History  Substance Use Topics  . Smoking status: Former Smoker    Packs/day: 0.45    Years: 6.00    Types: Cigarettes    Quit date: 12/07/2007  . Smokeless tobacco: Never Used  . Alcohol use No     Allergies   Patient has no known allergies.   Review of Systems Review of Systems  HENT: Positive for congestion, postnasal drip,  rhinorrhea, sore throat and voice change. Negative for drooling, ear discharge, ear pain, hearing loss, sinus pressure and trouble swallowing.   Respiratory: Positive for cough. Negative for chest tightness, shortness of breath, wheezing and stridor.   Cardiovascular: Negative for chest pain.  Gastrointestinal: Negative for abdominal pain, nausea and vomiting.  Skin: Negative for rash.  Neurological: Negative for headaches.     Physical Exam Updated Vital Signs Pulse 67   Temp 98.5 F (36.9 C) (Oral)   Resp 16   Ht 5\' 7"  (1.702 m)   Wt 124.7 kg (275 lb)   LMP 05/13/2017   SpO2 97%   BMI 43.07 kg/m   Physical Exam  Constitutional: Vital signs are normal. She appears well-developed and well-nourished.  HENT:  Head: Normocephalic and atraumatic.  Right Ear: Tympanic membrane, external ear and ear canal normal.  Left Ear: Tympanic membrane, external ear and ear canal normal.  Nose: Mucosal edema and rhinorrhea present. No nasal deformity or septal deviation.  Mouth/Throat: Uvula is midline, oropharynx is clear and moist and mucous membranes are normal. No uvula swelling. No oropharyngeal  exudate. No tonsillar exudate.  Neck: Trachea normal and full passive range of motion without pain. No tracheal tenderness present. No neck rigidity. No tracheal deviation present.  Pulmonary/Chest: Effort normal. No respiratory distress. She has no decreased breath sounds. She has no wheezes. She has no rhonchi.  Skin: Skin is warm and dry. No rash noted. She is not diaphoretic.  Nursing note and vitals reviewed.    ED Treatments / Results  Labs (all labs ordered are listed, but only abnormal results are displayed) Labs Reviewed - No data to display  EKG  EKG Interpretation None       Radiology Dg Chest 2 View  Result Date: 06/01/2017 CLINICAL DATA:  Productive cough.  Shortness of breath.  Chest pain. EXAM: CHEST  2 VIEW COMPARISON:  12/27/2012. FINDINGS: Mediastinum hilar structures  are normal. Lungs are clear of infiltrates. Previously identified infiltrate right upper lobe on 12/27/2012 has cleared. No pleural effusion or pneumothorax. Degenerative changes thoracic spine . IMPRESSION: No acute cardiopulmonary disease. Electronically Signed   By: Maisie Fushomas  Register   On: 06/01/2017 10:11    Procedures Procedures (including critical care time)  Medications Ordered in ED Medications - No data to display   Initial Impression / Assessment and Plan / ED Course  I have reviewed the triage vital signs and the nursing notes.  Pertinent labs & imaging results that were available during my care of the patient were reviewed by me and considered in my medical decision making (see chart for details).    Pt CXR negative for acute infiltrate. Patients symptoms are consistent with URI, likely viral etiology. Discussed that antibiotics are not indicated for viral infections. Pt will be discharged with symptomatic treatment.  Verbalizes understanding and is agreeable with plan. Pt is hemodynamically stable & in NAD prior to dc.   Final Clinical Impressions(s) / ED Diagnoses   Final diagnoses:  Cough    New Prescriptions New Prescriptions   No medications on file     Norman ClayHammond, Creig Landin W, PA-C 06/01/17 1025    Samuel JesterMcManus, Kathleen, DO 06/04/17 [phone]

## 2017-06-01 NOTE — ED Triage Notes (Signed)
Pt c/o productive cough and scratchy throat x 2 days. Fever of 100.0 yesterday.

## 2017-07-08 ENCOUNTER — Emergency Department (HOSPITAL_COMMUNITY)
Admission: EM | Admit: 2017-07-08 | Discharge: 2017-07-08 | Disposition: A | Discharge status: Home / Self Care | Payer: Self-pay | Attending: Emergency Medicine | Admitting: Emergency Medicine

## 2017-07-08 ENCOUNTER — Emergency Department (HOSPITAL_COMMUNITY): Payer: Self-pay

## 2017-07-08 ENCOUNTER — Encounter (HOSPITAL_COMMUNITY): Payer: Self-pay | Admitting: Emergency Medicine

## 2017-07-08 DIAGNOSIS — Z87891 Personal history of nicotine dependence: Secondary | ICD-10-CM | POA: Insufficient documentation

## 2017-07-08 DIAGNOSIS — Z79899 Other long term (current) drug therapy: Secondary | ICD-10-CM | POA: Insufficient documentation

## 2017-07-08 DIAGNOSIS — R509 Fever, unspecified: Secondary | ICD-10-CM | POA: Insufficient documentation

## 2017-07-08 HISTORY — DX: Post-traumatic stress disorder, unspecified: F43.10

## 2017-07-08 HISTORY — DX: Borderline personality disorder: F60.3

## 2017-07-08 LAB — BASIC METABOLIC PANEL
ANION GAP: 7 (ref 5–15)
BUN: 9 mg/dL (ref 6–20)
CALCIUM: 8.3 mg/dL — AB (ref 8.9–10.3)
CO2: 23 mmol/L (ref 22–32)
Chloride: 103 mmol/L (ref 101–111)
Creatinine, Ser: 0.63 mg/dL (ref 0.44–1.00)
Glucose, Bld: 108 mg/dL — ABNORMAL HIGH (ref 65–99)
POTASSIUM: 3.5 mmol/L (ref 3.5–5.1)
SODIUM: 133 mmol/L — AB (ref 135–145)

## 2017-07-08 LAB — LACTIC ACID, PLASMA
LACTIC ACID, VENOUS: 0.5 mmol/L (ref 0.5–1.9)
LACTIC ACID, VENOUS: 0.7 mmol/L (ref 0.5–1.9)

## 2017-07-08 LAB — URINALYSIS, ROUTINE W REFLEX MICROSCOPIC
Bacteria, UA: NONE SEEN
Bilirubin Urine: NEGATIVE
GLUCOSE, UA: NEGATIVE mg/dL
Hgb urine dipstick: NEGATIVE
KETONES UR: NEGATIVE mg/dL
Nitrite: NEGATIVE
PH: 6 (ref 5.0–8.0)
PROTEIN: NEGATIVE mg/dL
SPECIFIC GRAVITY, URINE: 1.017 (ref 1.005–1.030)
SQUAMOUS EPITHELIAL / LPF: NONE SEEN

## 2017-07-08 LAB — CBC WITH DIFFERENTIAL/PLATELET
BASOS PCT: 1 %
Basophils Absolute: 0.1 10*3/uL (ref 0.0–0.1)
EOS ABS: 0.2 10*3/uL (ref 0.0–0.7)
Eosinophils Relative: 2 %
HEMATOCRIT: 34.3 % — AB (ref 36.0–46.0)
HEMOGLOBIN: 11.8 g/dL — AB (ref 12.0–15.0)
LYMPHS ABS: 2.3 10*3/uL (ref 0.7–4.0)
Lymphocytes Relative: 23 %
MCH: 30.3 pg (ref 26.0–34.0)
MCHC: 34.4 g/dL (ref 30.0–36.0)
MCV: 87.9 fL (ref 78.0–100.0)
Monocytes Absolute: 2 10*3/uL — ABNORMAL HIGH (ref 0.1–1.0)
Monocytes Relative: 20 %
NEUTROS ABS: 5.6 10*3/uL (ref 1.7–7.7)
NEUTROS PCT: 54 %
Platelets: 180 10*3/uL (ref 150–400)
RBC: 3.9 MIL/uL (ref 3.87–5.11)
RDW: 13.4 % (ref 11.5–15.5)
WBC: 10.2 10*3/uL (ref 4.0–10.5)

## 2017-07-08 LAB — PREGNANCY, URINE: Preg Test, Ur: NEGATIVE

## 2017-07-08 MED ORDER — ACETAMINOPHEN 500 MG PO TABS
1000.0000 mg | ORAL_TABLET | Freq: Once | ORAL | Status: AC
  Administered 2017-07-08: 1000 mg via ORAL
  Filled 2017-07-08: qty 2

## 2017-07-08 MED ORDER — SODIUM CHLORIDE 0.9 % IV BOLUS (SEPSIS)
1000.0000 mL | Freq: Once | INTRAVENOUS | Status: AC
  Administered 2017-07-08: 1000 mL via INTRAVENOUS

## 2017-07-08 NOTE — Discharge Instructions (Signed)
Take over the counter tylenol and ibuprofen, as directed on packaging, as needed for fever or discomfort.  Call your regular medical doctor today to schedule a follow up appointment within the next 2 to 3 days.  Return to the Emergency Department immediately sooner if worsening.

## 2017-07-08 NOTE — ED Triage Notes (Signed)
Patient complaining of intermittent headache, fever, and stiff neck x 1 month. States has had no tylenol or ibuprofen for fever today. States pain and fever has been constant now for 2-3 days.

## 2017-07-08 NOTE — ED Notes (Signed)
Pt refuses lumbar puncture. Pt says will get ibuprofen and schedule appointment with primary provider.

## 2017-07-08 NOTE — ED Provider Notes (Signed)
[facility]-EMERGENCY DEPT Provider Note   CSN: [id number] Arrival date & time: 07/08/17  0718     History   Chief Complaint Chief Complaint  Patient presents with  . Headache    w/ stiff neck and fever    HPI Sonya Trujillo is a 41 y.o. female.   Headache      Pt was seen at 0730. Per pt, c/o gradual onset and persistence of multiple intermittent episodes of fever for the past 1 month. Has been associated with cough, acute flair of her chronic headache, as well as "stiff neck." LD tylenol and motrin was yesterday afternoon. Pt denies any other symptoms. Denies visual changes, no focal motor weakness, no tingling/numbness in extremities, no ataxia, no slurred speech, no facial droop.  Denies headache was sudden or maximal in onset or at any time.  Denies abd pain, no N/V/D, no back pain, no CP/palpitations, no SOB, no rash, no calf/LE pain or unilateral swelling.    Past Medical History:  Diagnosis Date  . Borderline personality disorder   . Migraine   . PTSD (post-traumatic stress disorder)     Patient Active Problem List   Diagnosis Date Noted  . COMMON MIGRAINE 10/23/2007  . OBESITY NOS 03/10/2007  . ECZEMA, ATOPIC DERMATITIS 02/02/2007    Past Surgical History:  Procedure Laterality Date  . TUBAL LIGATION      OB History    Gravida Para Term Preterm AB Living   2 2           SAB TAB Ectopic Multiple Live Births                   Home Medications    Prior to Admission medications   Medication Sig Start Date End Date Taking? Authorizing Provider  FLUoxetine (PROZAC) 20 MG tablet Take 60 mg by mouth daily.    [provider]  ibuprofen (ADVIL,MOTRIN) 200 MG tablet Take 800 mg by mouth every 6 (six) hours as needed. Pain.    [provider]    Family History Family History  Problem Relation Age of Onset  . Cancer Father   . Diabetes Father   . Stroke Other   . Cancer Other   . CAD Other     Social History Social History  Substance  Use Topics  . Smoking status: Former Smoker    Packs/day: 0.45    Years: 6.00    Types: Cigarettes    Quit date: 12/07/2007  . Smokeless tobacco: Never Used  . Alcohol use No     Allergies   Patient has no known allergies.   Review of Systems Review of Systems  Neurological: Positive for headaches.  ROS: Statement: All systems negative except as marked or noted in the HPI; Constitutional: +fever and chills. ; ; Eyes: Negative for eye pain, redness and discharge. ; ; ENMT: Negative for ear pain, hoarseness, nasal congestion, sinus pressure and sore throat. ; ; Cardiovascular: Negative for chest pain, palpitations, diaphoresis, dyspnea and peripheral edema. ; ; Respiratory: +cough. Negative for wheezing and stridor. ; ; Gastrointestinal: Negative for nausea, vomiting, diarrhea, abdominal pain, blood in stool, hematemesis, jaundice and rectal bleeding. . ; ; Genitourinary: Negative for dysuria, flank pain and hematuria. ; ; Musculoskeletal: Negative for back pain and neck pain. Negative for swelling and trauma.; ; Skin: Negative for pruritus, rash, abrasions, blisters, bruising and skin lesion.; ; Neuro: Negative for lightheadedness and +headache, +neck stiffness. Negative for weakness, altered level of consciousness, altered mental  status, extremity weakness, paresthesias, involuntary movement, seizure and syncope.      Physical Exam Updated Vital Signs BP (!) 141/78 (BP Location: Right Arm)   Pulse 73   Temp (!) 101.2 F (38.4 C) (Oral)   Resp 20   Ht 5\' 6"  (1.676 m)   Wt 124.7 kg (275 lb)   LMP 06/07/2017   SpO2 99%   BMI 44.39 kg/m    BP 111/78   Pulse 69   Temp 98.9 F (37.2 C) (Oral)   Resp 20   Ht 5\' 6"  (1.676 m)   Wt 124.7 kg (275 lb)   LMP 06/07/2017 Comment: tubal ligation  SpO2 99%   BMI 44.39 kg/m    Physical Exam 0735: Physical examination:  Nursing notes reviewed; Vital signs and O2 SAT reviewed;  Constitutional: Well developed, Well nourished, Well  hydrated, In no acute distress. Non-toxic appearing.; Head:  Normocephalic, atraumatic; Eyes: EOMI, PERRL, No scleral icterus; ENMT: TM's clear bilat. +edemetous nasal turbinates bilat with clear rhinorrhea. Mouth and pharynx without lesions. No tonsillar exudates. No intra-oral edema. No submandibular or sublingual edema. No hoarse voice, no drooling, no stridor. No pain with manipulation of larynx. No trismus.  Mouth and pharynx normal, Mucous membranes moist; Neck: Supple, Full range of motion, No lymphadenopathy. No meningeal signs. Pt is spontaneously flexing her neck and putting her chin to her chest during my exam without difficulty.; Cardiovascular: Regular rate and rhythm, No gallop; Respiratory: Breath sounds clear & equal bilaterally, No wheezes.  Speaking full sentences with ease, Normal respiratory effort/excursion; Chest: Nontender, Movement normal; Abdomen: Soft, Nontender, Nondistended, Normal bowel sounds; Genitourinary: No CVA tenderness; Spine:  No midline CS, TS, LS tenderness. +TTP bilat hypertonic trapezius muscles. No rash.;; Extremities: Pulses normal, No tenderness, No edema, No calf edema or asymmetry.; Neuro: AA&Ox3, Major CN grossly intact.  Speech clear. No gross focal motor or sensory deficits in extremities. Climbs on and off stretcher easily by herself. Gait steady..; Skin: Color normal, Warm, Dry. No rash.   ED Treatments / Results  Labs (all labs ordered are listed, but only abnormal results are displayed)   EKG  EKG Interpretation None       Radiology   Procedures Procedures (including critical care time)  Medications Ordered in ED Medications  acetaminophen (TYLENOL) tablet 1,000 mg (not administered)  sodium chloride 0.9 % bolus 1,000 mL (not administered)     Initial Impression / Assessment and Plan / ED Course  I have reviewed the triage vital signs and the nursing notes.  Pertinent labs & imaging results that were available during my care of the  patient were reviewed by me and considered in my medical decision making (see chart for details).  MDM Reviewed: previous chart, nursing note and vitals Reviewed previous: labs Interpretation: labs, x-ray and CT scan   Results for orders placed or performed during the hospital encounter of 07/08/17  Culture, blood (routine x 2)  Result Value Ref Range   Specimen Description RIGHT ANTECUBITAL    Special Requests      BOTTLES DRAWN AEROBIC AND ANAEROBIC Blood Culture adequate volume   Culture PENDING    Report Status PENDING   Culture, blood (routine x 2)  Result Value Ref Range   Specimen Description BLOOD RIGHT HAND    Special Requests      BOTTLES DRAWN AEROBIC AND ANAEROBIC Blood Culture adequate volume   Culture PENDING    Report Status PENDING   Urinalysis, Routine w reflex microscopic  Result  Value Ref Range   Color, Urine YELLOW YELLOW   APPearance CLEAR CLEAR   Specific Gravity, Urine 1.017 1.005 - 1.030   pH 6.0 5.0 - 8.0   Glucose, UA NEGATIVE NEGATIVE mg/dL   Hgb urine dipstick NEGATIVE NEGATIVE   Bilirubin Urine NEGATIVE NEGATIVE   Ketones, ur NEGATIVE NEGATIVE mg/dL   Protein, ur NEGATIVE NEGATIVE mg/dL   Nitrite NEGATIVE NEGATIVE   Leukocytes, UA TRACE (A) NEGATIVE   RBC / HPF 0-5 0 - 5 RBC/hpf   WBC, UA 0-5 0 - 5 WBC/hpf   Bacteria, UA NONE SEEN NONE SEEN   Squamous Epithelial / LPF NONE SEEN NONE SEEN   Mucous PRESENT   Pregnancy, urine  Result Value Ref Range   Preg Test, Ur NEGATIVE NEGATIVE  CBC with Differential  Result Value Ref Range   WBC 10.2 4.0 - 10.5 K/uL   RBC 3.90 3.87 - 5.11 MIL/uL   Hemoglobin 11.8 (L) 12.0 - 15.0 g/dL   HCT 16.134.3 (L) 09.636.0 - 04.546.0 %   MCV 87.9 78.0 - 100.0 fL   MCH 30.3 26.0 - 34.0 pg   MCHC 34.4 30.0 - 36.0 g/dL   RDW 40.913.4 81.111.5 - 91.415.5 %   Platelets 180 150 - 400 K/uL   Neutrophils Relative % 54 %   Neutro Abs 5.6 1.7 - 7.7 K/uL   Lymphocytes Relative 23 %   Lymphs Abs 2.3 0.7 - 4.0 K/uL   Monocytes Relative 20 %     Monocytes Absolute 2.0 (H) 0.1 - 1.0 K/uL   Eosinophils Relative 2 %   Eosinophils Absolute 0.2 0.0 - 0.7 K/uL   Basophils Relative 1 %   Basophils Absolute 0.1 0.0 - 0.1 K/uL  Lactic acid, plasma  Result Value Ref Range   Lactic Acid, Venous 0.5 0.5 - 1.9 mmol/L  Lactic acid, plasma  Result Value Ref Range   Lactic Acid, Venous 0.7 0.5 - 1.9 mmol/L  Basic metabolic panel  Result Value Ref Range   Sodium 133 (L) 135 - 145 mmol/L   Potassium 3.5 3.5 - 5.1 mmol/L   Chloride 103 101 - 111 mmol/L   CO2 23 22 - 32 mmol/L   Glucose, Bld 108 (H) 65 - 99 mg/dL   BUN 9 6 - 20 mg/dL   Creatinine, Ser 7.820.63 0.44 - 1.00 mg/dL   Calcium 8.3 (L) 8.9 - 10.3 mg/dL   GFR calc non Af Amer >60 >60 mL/min   GFR calc Af Amer >60 >60 mL/min   Anion gap 7 5 - 15   Dg Chest 2 View Result Date: 07/08/2017 CLINICAL DATA:  Cough and fever EXAM: CHEST  2 VIEW COMPARISON:  Chest radiograph June 01, 2017 FINDINGS: Lungs are clear. The heart size and pulmonary vascularity are normal. No adenopathy. No bone lesions. IMPRESSION: No edema or consolidation. Electronically Signed   By: Bretta BangWilliam  Woodruff III M.D.   On: 07/08/2017 08:43   Ct Head Wo Contrast Result Date: 07/08/2017 CLINICAL DATA:  Headache, fever and stiff neck. EXAM: CT HEAD WITHOUT CONTRAST TECHNIQUE: Contiguous axial images were obtained from the base of the skull through the vertex without intravenous contrast. COMPARISON:  MRI of the brain on 10/27/2007 FINDINGS: Brain: No evidence of acute infarction, hemorrhage, hydrocephalus, extra-axial collection or mass lesion/mass effect. Vascular: No hyperdense vessel or unexpected calcification. Skull: Normal. Negative for fracture or focal lesion. Sinuses/Orbits: No acute finding. Other: None. IMPRESSION: Normal head CT. Electronically Signed   By: Rudene AndaGlenn  Yamagata M.D.  On: 07/08/2017 08:37      0735:  Doubt meningitis after 1 month of symptoms and pt clinically has FROM CS, no neuro deficits, and  appears non-toxic. Watching TV, using cellphone.  Risks/benefits of LP to r/o meningitis explained to pt: pt refuses. Pt makes her own medical decisions. Workup ordered.   1030:  Fever improved with APAP. Pt continues non-toxic appearing, NAD, resps easy, neuro exam intact. Continues to refuse LP. Tx symptomatically at this time. Dx and testing d/w pt.  Questions answered.  Verb understanding, agreeable to d/c home with outpt f/u.   Final Clinical Impressions(s) / ED Diagnoses   Final diagnoses:  None    New Prescriptions New Prescriptions   No medications on file      Samuel Jester, DO 07/11/17 1328

## 2017-07-09 LAB — URINE CULTURE

## 2017-07-13 LAB — CULTURE, BLOOD (ROUTINE X 2)
CULTURE: NO GROWTH
CULTURE: NO GROWTH
SPECIAL REQUESTS: ADEQUATE
Special Requests: ADEQUATE

## 2017-10-06 IMAGING — DX DG CHEST 2V
2 series · 2 of 2 positions shown · non-contrast
Comparison: Chest radiograph June 01, 2017

CLINICAL DATA: Cough and fever

EXAM:
CHEST  2 VIEW

[chest pa]
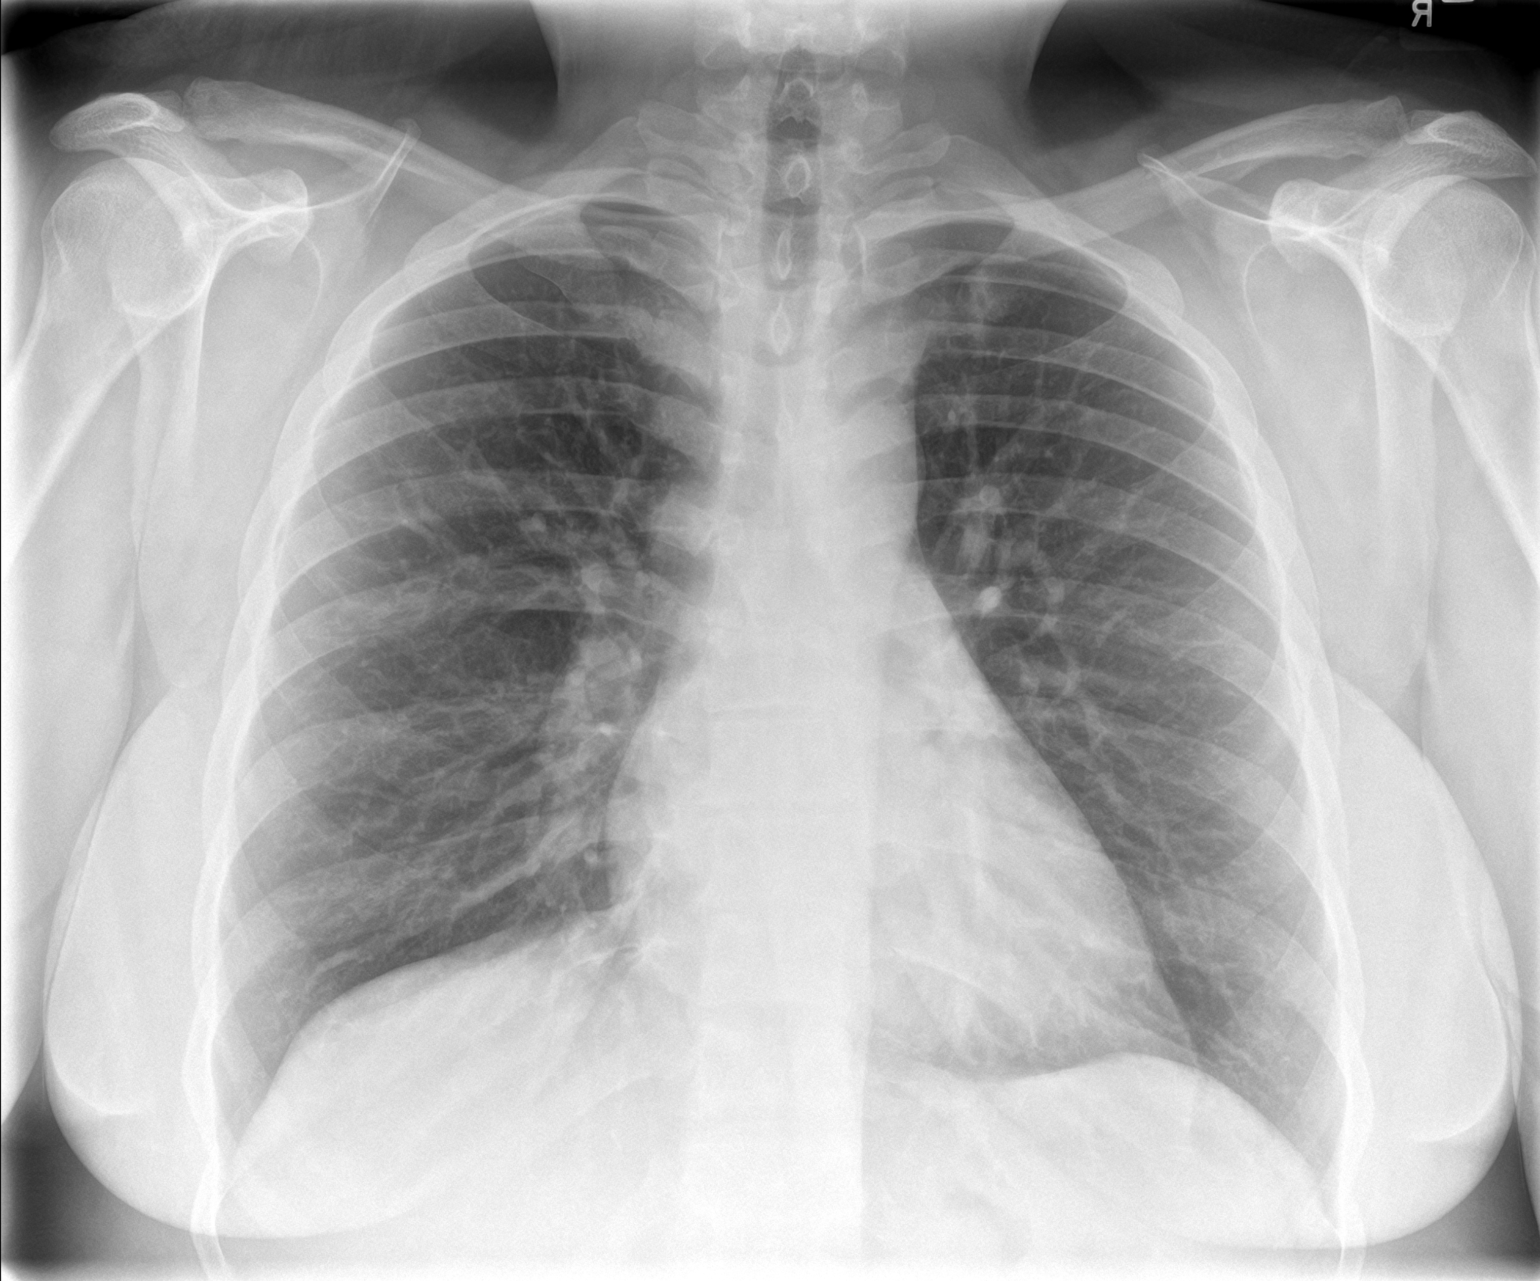

[chest lat]
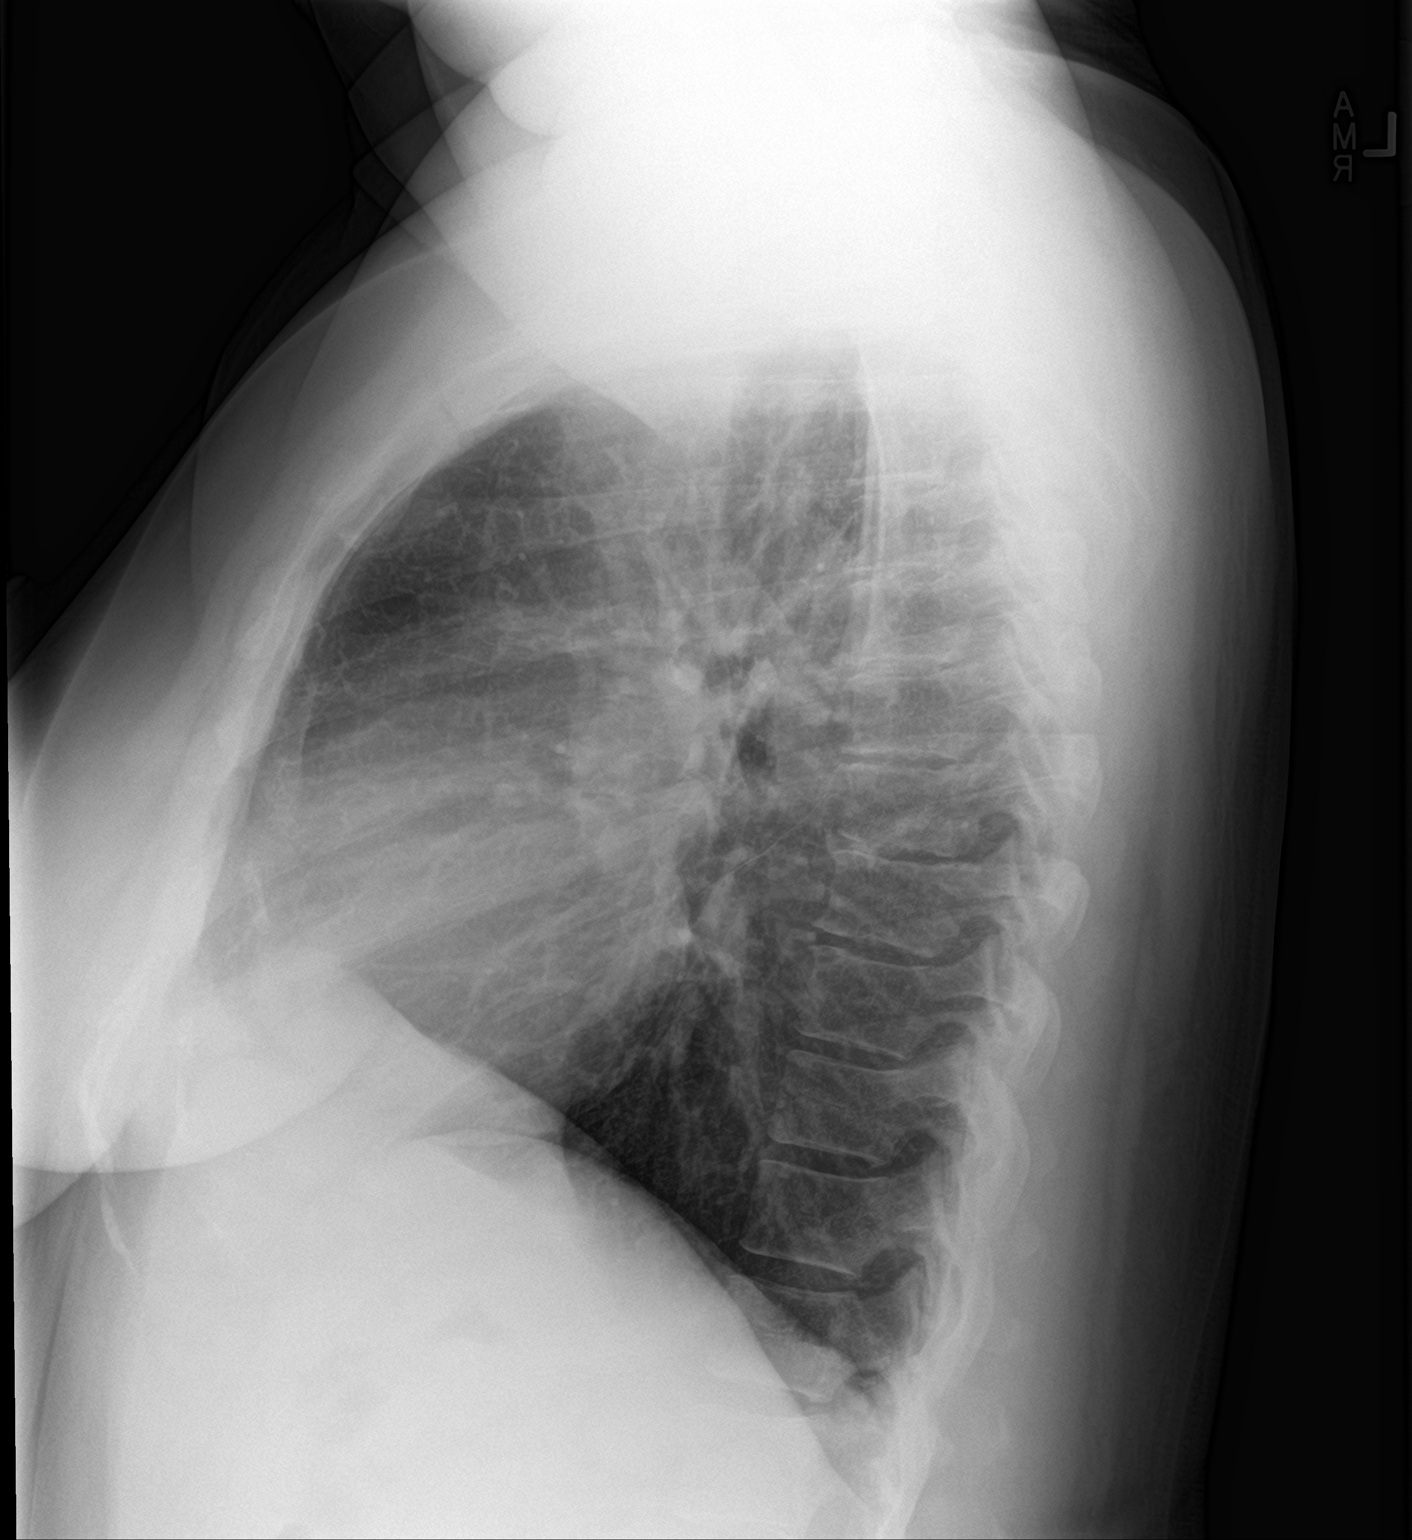

[2 of 2 positions shown; findings below may reference images not displayed]

FINDINGS: Lungs are clear. The heart size and pulmonary vascularity are
normal. No adenopathy. No bone lesions.
IMPRESSION: No edema or consolidation.

## 2017-10-06 IMAGING — CT CT HEAD W/O CM
3 series · 15 of 47 positions shown, 18 images · non-contrast
Comparison: MRI of the brain on 10/27/2007

CLINICAL DATA: Headache, fever and stiff neck.

EXAM:
CT HEAD WITHOUT CONTRAST
TECHNIQUE: Contiguous axial images were obtained from the base of the skull
through the vertex without intravenous contrast.

[Series 2: head wo · axial · 0.45mm/px · z∈[+385,+510]mm · 9 of 31 slices shown, 12 images]
[im 3/31  brain]
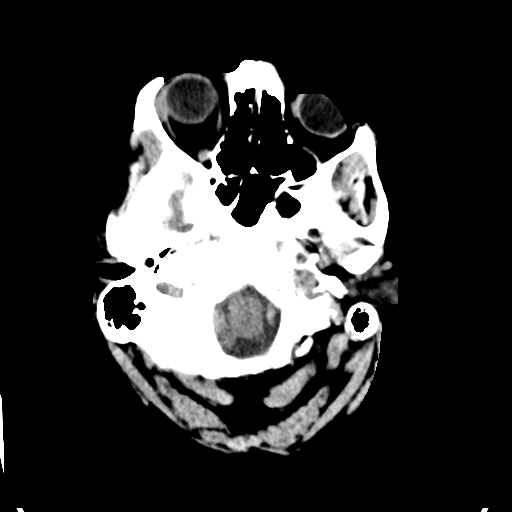
[im 3/31  bone]
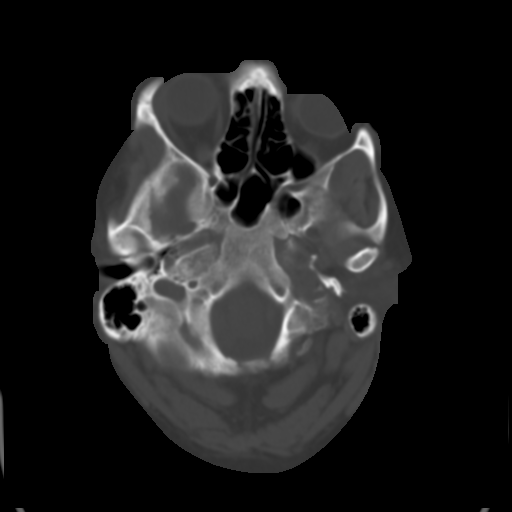
[im 6/31  brain]
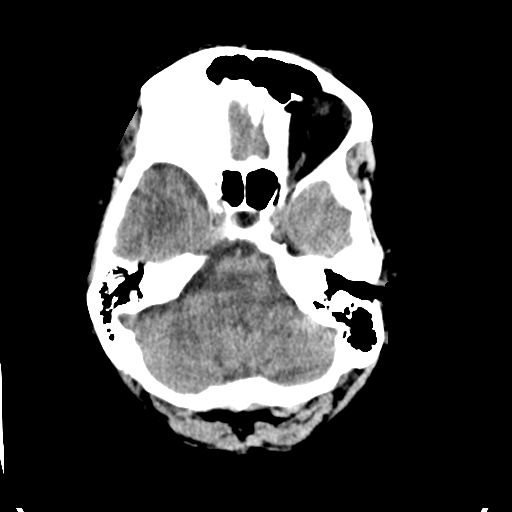
[im 9/31  brain]
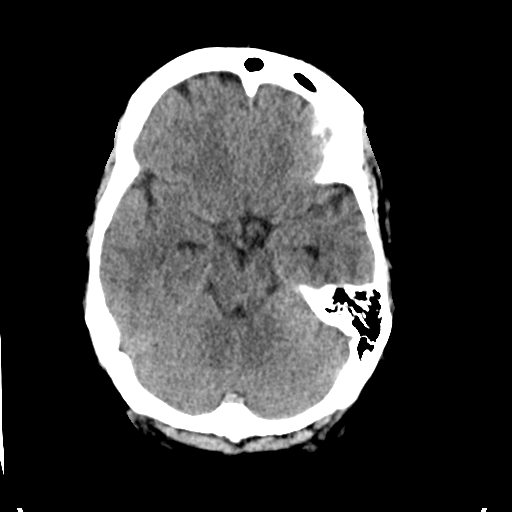
[im 12/31  brain]
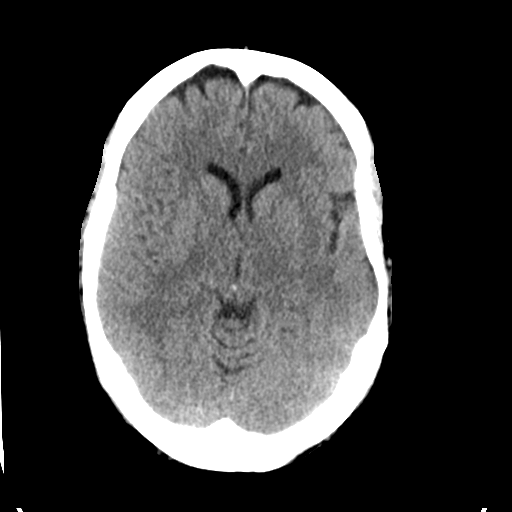
[im 16/31  brain]
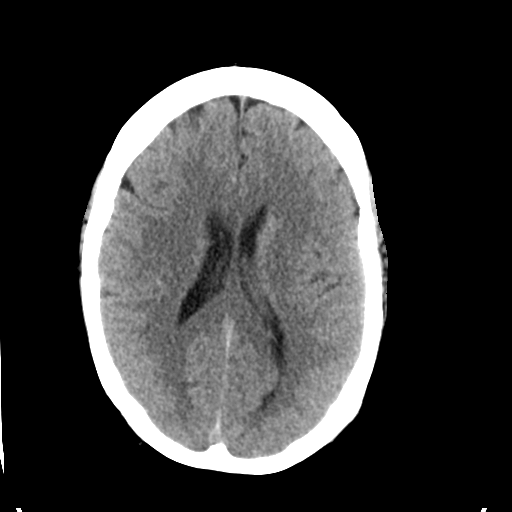
[im 16/31  bone]
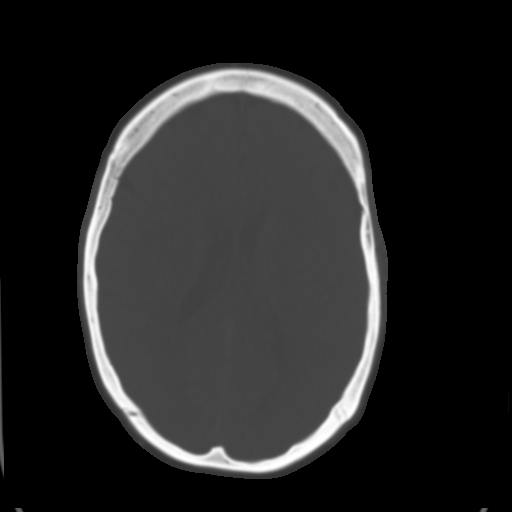
[im 19/31  brain]
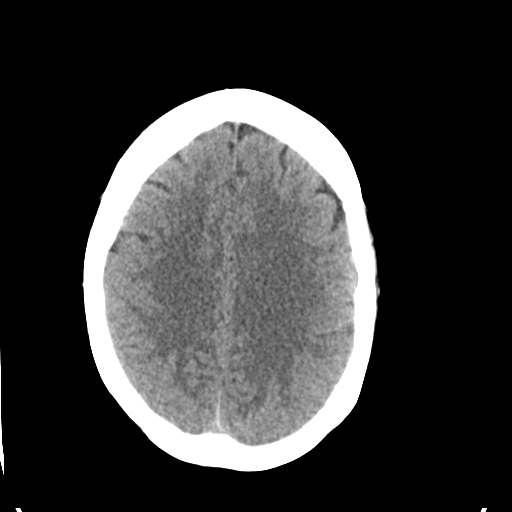
[im 22/31  brain]
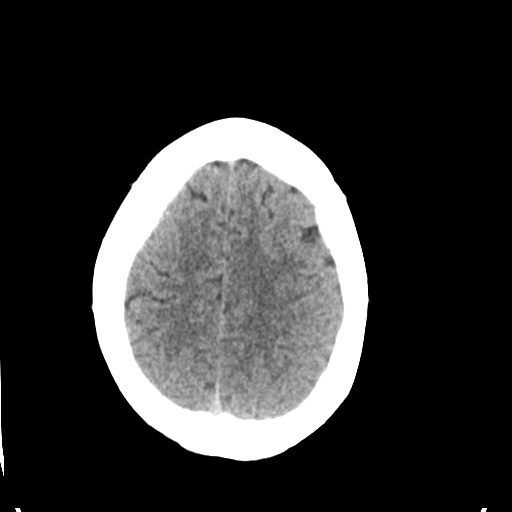
[im 25/31  brain]
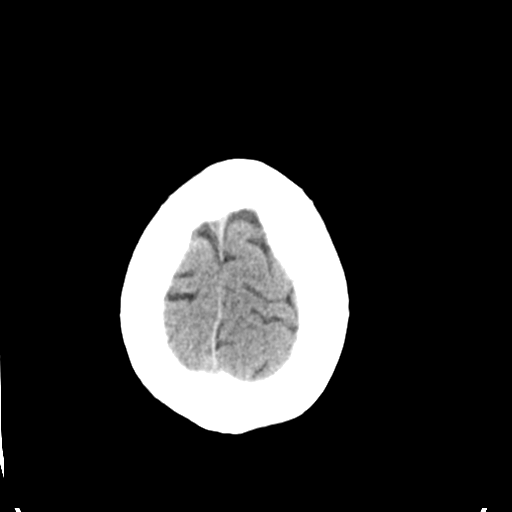
[im 28/31  brain]
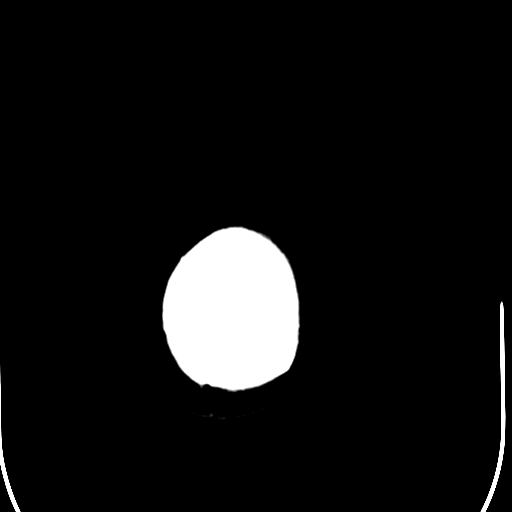
[im 28/31  bone]
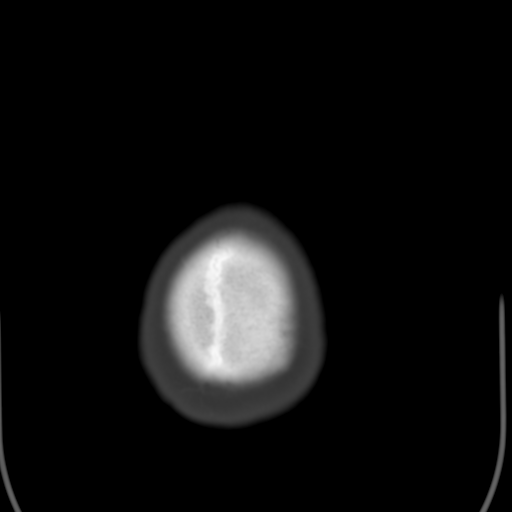

[Series 4: coronal soft tissue · coronal · 0.29mm/px · 3 of 70 slices shown]
[im 24/70  brain]
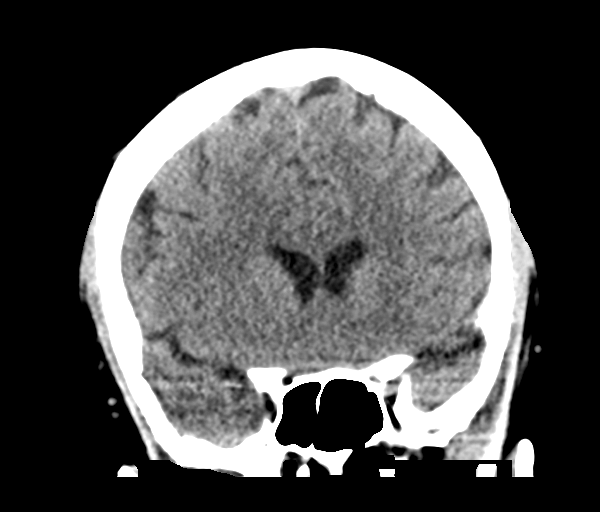
[im 31/70  brain]
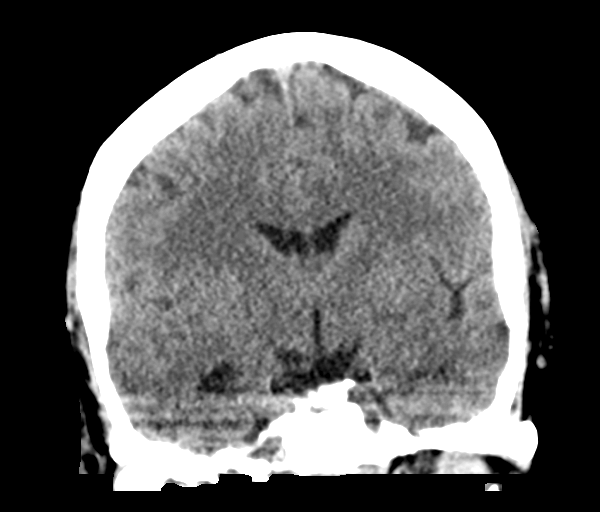
[im 39/70  brain]
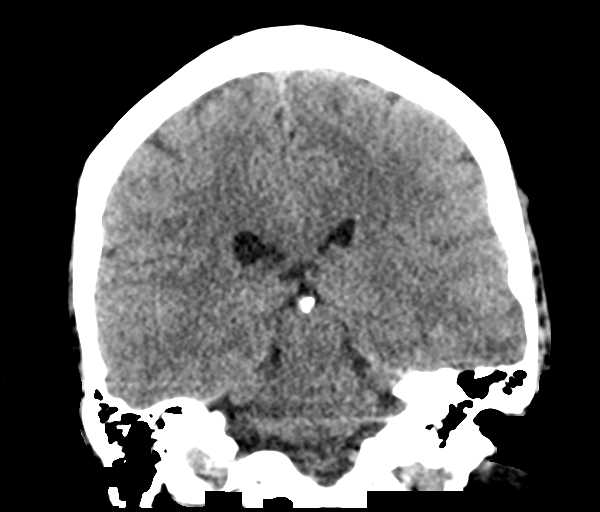

[Series 5: sagittal soft tissue · sagittal · 0.34mm/px · 3 of 58 slices shown]
[im 20/58  brain]
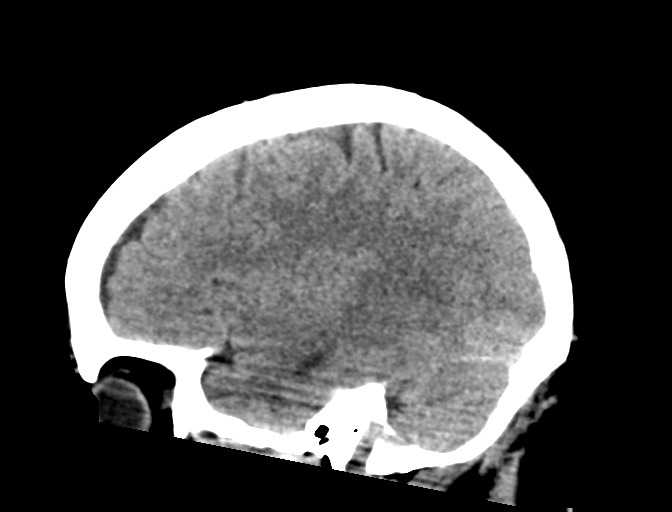
[im 29/58  brain]
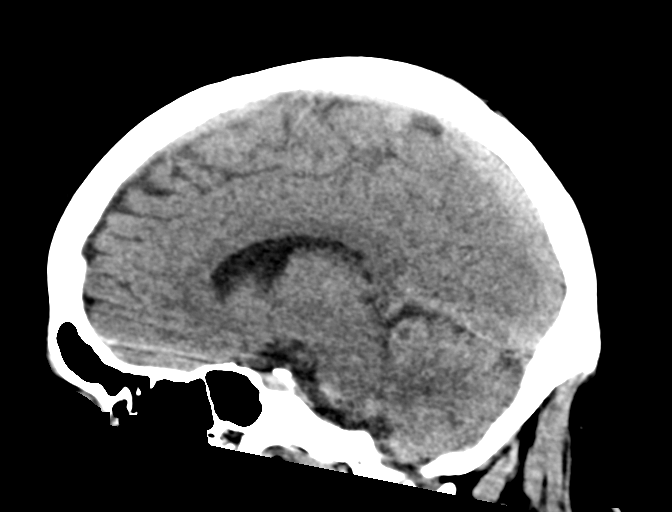
[im 39/58  brain]
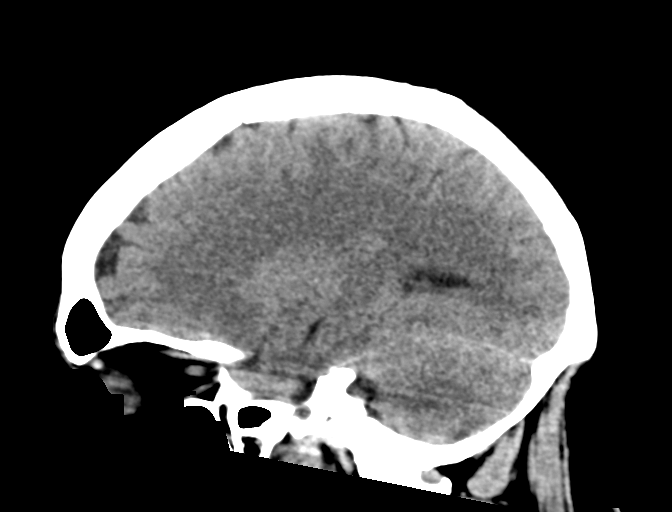

[15 of 47 positions shown; findings below may reference images not displayed]

FINDINGS: Brain: No evidence of acute infarction, hemorrhage, hydrocephalus,
extra-axial collection or mass lesion/mass effect.

Vascular: No hyperdense vessel or unexpected calcification.

Skull: Normal. Negative for fracture or focal lesion.

Sinuses/Orbits: No acute finding.

Other: None.
IMPRESSION: Normal head CT.

## 2019-09-03 ENCOUNTER — Other Ambulatory Visit: Payer: Self-pay

## 2019-09-03 ENCOUNTER — Emergency Department (HOSPITAL_COMMUNITY): Payer: Self-pay

## 2019-09-03 ENCOUNTER — Emergency Department (HOSPITAL_COMMUNITY)
Admission: EM | Admit: 2019-09-03 | Discharge: 2019-09-04 | Disposition: A | Discharge status: Home / Self Care | Payer: Self-pay | Attending: Emergency Medicine | Admitting: Emergency Medicine

## 2019-09-03 ENCOUNTER — Encounter (HOSPITAL_COMMUNITY): Payer: Self-pay

## 2019-09-03 DIAGNOSIS — R0789 Other chest pain: Secondary | ICD-10-CM | POA: Insufficient documentation

## 2019-09-03 DIAGNOSIS — R42 Dizziness and giddiness: Secondary | ICD-10-CM | POA: Insufficient documentation

## 2019-09-03 DIAGNOSIS — R079 Chest pain, unspecified: Secondary | ICD-10-CM

## 2019-09-03 DIAGNOSIS — Z87891 Personal history of nicotine dependence: Secondary | ICD-10-CM | POA: Insufficient documentation

## 2019-09-03 DIAGNOSIS — F419 Anxiety disorder, unspecified: Secondary | ICD-10-CM | POA: Insufficient documentation

## 2019-09-03 HISTORY — DX: Panic disorder (episodic paroxysmal anxiety): F41.0

## 2019-09-03 LAB — BASIC METABOLIC PANEL
Anion gap: 7 (ref 5–15)
BUN: 15 mg/dL (ref 6–20)
CO2: 25 mmol/L (ref 22–32)
Calcium: 8.7 mg/dL — ABNORMAL LOW (ref 8.9–10.3)
Chloride: 106 mmol/L (ref 98–111)
Creatinine, Ser: 0.8 mg/dL (ref 0.44–1.00)
GFR calc Af Amer: >60 mL/min (ref >60)
GFR calc non Af Amer: >60 mL/min (ref >60)
Glucose, Bld: 105 mg/dL — ABNORMAL HIGH (ref 70–99)
Potassium: 3.6 mmol/L (ref 3.5–5.1)
Sodium: 138 mmol/L (ref 135–145)

## 2019-09-03 LAB — TROPONIN I (HIGH SENSITIVITY): Troponin I (High Sensitivity): <2 ng/L (ref <18)

## 2019-09-03 LAB — CBC
HCT: 41.7 % (ref 36.0–46.0)
Hemoglobin: 13.8 g/dL (ref 12.0–15.0)
MCH: 30.9 pg (ref 26.0–34.0)
MCHC: 33.1 g/dL (ref 30.0–36.0)
MCV: 93.3 fL (ref 80.0–100.0)
Platelets: 247 10*3/uL (ref 150–400)
RBC: 4.47 MIL/uL (ref 3.87–5.11)
RDW: 12.8 % (ref 11.5–15.5)
WBC: 11 10*3/uL — ABNORMAL HIGH (ref 4.0–10.5)
nRBC: 0 % (ref 0.0–0.2)

## 2019-09-03 LAB — POC URINE PREG, ED: Preg Test, Ur: NEGATIVE

## 2019-09-03 MED ORDER — LORAZEPAM 1 MG PO TABS
1.0000 mg | ORAL_TABLET | Freq: Once | ORAL | Status: AC
  Administered 2019-09-04: 1 mg via ORAL
  Filled 2019-09-03: qty 1

## 2019-09-03 MED ORDER — ACETAMINOPHEN 500 MG PO TABS
1000.0000 mg | ORAL_TABLET | Freq: Once | ORAL | Status: AC
Start: 2019-09-04 — End: 2019-09-04
  Administered 2019-09-04: 1000 mg via ORAL
  Filled 2019-09-03: qty 2

## 2019-09-03 NOTE — ED Triage Notes (Signed)
Pt c/o SOB, upper back pain, dizziness and SOB x 2 days, pt reports hx of panic attacks but this feels different

## 2019-09-03 NOTE — ED Provider Notes (Addendum)
[facility] EMERGENCY DEPARTMENT Provider Note   CSN: [id number] Arrival date & time: 09/03/19  2048     History   Chief Complaint Chief Complaint  Patient presents with  . Shortness of Breath    HPI Sonya Trujillo is a 43 y.o. female.     Patient is a 43 year old female who presents to the emergency department with a complaint of shortness of breath and dizziness.  The patient states that this chest pain is been going on for about 2 days.  She describes it as a tightness that at times goes to her back.  It is accompanied by dizziness and sensation of shortness of breath.  The patient states that she has had the problems with shortness of breath and dizziness from time to time as she has panic attacks.  She says this was somehow feels a little different than her usual panic attack.  The patient denies any recent injury or trauma to the chest or abdomen.  She denies fever, loss of consciousness, vomiting, injury, cough, operations or procedures.  She denies any known exposure to the COVID-19 virus.  She has not been traveling recently, and she is not been in any crowds for any extended period of time.  Nothing seems to make the pain any better, and nothing makes it any worse.  The patient denies suicidal or homicidal ideations.  The history is provided by the patient.  Shortness of Breath Associated symptoms: no abdominal pain, no chest pain, no cough, no ear pain, no neck pain, no rash, no vomiting and no wheezing     Past Medical History:  Diagnosis Date  . Borderline personality disorder ([place])   . Migraine   . Panic attack   . PTSD (post-traumatic stress disorder)     Patient Active Problem List   Diagnosis Date Noted  . COMMON MIGRAINE 10/23/2007  . OBESITY NOS 03/10/2007  . ECZEMA, ATOPIC DERMATITIS 02/02/2007    Past Surgical History:  Procedure Laterality Date  . TUBAL LIGATION       OB History    Gravida  2   Para  2   Term      Preterm      AB      Living        SAB      TAB      Ectopic      Multiple      Live Births               Home Medications    Prior to Admission medications   Medication Sig Start Date End Date Taking? Authorizing Provider  acetaminophen (TYLENOL) 325 MG tablet Take 650 mg by mouth every 6 (six) hours as needed for moderate pain.    [provider]  FLUoxetine (PROZAC) 20 MG tablet Take 60 mg by mouth daily.    [provider]    Family History Family History  Problem Relation Age of Onset  . Cancer Father   . Diabetes Father   . Stroke Other   . Cancer Other   . CAD Other     Social History Social History   Tobacco Use  . Smoking status: Former Smoker    Packs/day: 0.45    Years: 6.00    Pack years: 2.70    Types: Cigarettes    Quit date: 12/07/2007    Years since quitting: 11.7  . Smokeless tobacco: Never Used  Substance Use Topics  . Alcohol use: No  .  Drug use: No     Allergies   Patient has no known allergies.   Review of Systems Review of Systems  Constitutional: Negative for activity change and appetite change.  HENT: Negative for congestion, ear discharge, ear pain, facial swelling, nosebleeds, rhinorrhea, sneezing and tinnitus.   Eyes: Negative for photophobia, pain and discharge.  Respiratory: Positive for chest tightness and shortness of breath. Negative for cough, choking and wheezing.   Cardiovascular: Negative for chest pain, palpitations and leg swelling.  Gastrointestinal: Negative for abdominal pain, blood in stool, constipation, diarrhea, nausea and vomiting.  Genitourinary: Negative for difficulty urinating, dysuria, flank pain, frequency and hematuria.  Musculoskeletal: Negative for back pain, gait problem, myalgias and neck pain.  Skin: Negative for color change, rash and wound.  Neurological: Positive for dizziness. Negative for seizures, syncope, facial asymmetry, speech difficulty, weakness and numbness.  Hematological:  Negative for adenopathy. Does not bruise/bleed easily.  Psychiatric/Behavioral: Negative for agitation, confusion, hallucinations, self-injury and suicidal ideas. The patient is nervous/anxious.      Physical Exam Updated Vital Signs BP (!) 147/89 (BP Location: Left Arm)   Pulse 61   Temp 98.6 F (37 C) (Oral)   Resp 17   Ht 5\' 6"  (1.676 m)   Wt 108.9 kg   LMP 09/01/2019   SpO2 98%   BMI 38.74 kg/m   Physical Exam Vitals signs and nursing note reviewed.  Constitutional:      Appearance: She is well-developed. She is not toxic-appearing.  HENT:     Head: Normocephalic.     Right Ear: Tympanic membrane and external ear normal.     Left Ear: Tympanic membrane and external ear normal.  Eyes:     General: Lids are normal.     Pupils: Pupils are equal, round, and reactive to light.  Neck:     Musculoskeletal: Normal range of motion and neck supple.     Vascular: No carotid bruit.  Cardiovascular:     Rate and Rhythm: Normal rate and regular rhythm.     Pulses: Normal pulses.     Heart sounds: Normal heart sounds.  Pulmonary:     Effort: No respiratory distress.     Breath sounds: Normal breath sounds.  Abdominal:     General: Bowel sounds are normal.     Palpations: Abdomen is soft.     Tenderness: There is no abdominal tenderness. There is no guarding.  Musculoskeletal: Normal range of motion.  Lymphadenopathy:     Head:     Right side of head: No submandibular adenopathy.     Left side of head: No submandibular adenopathy.     Cervical: No cervical adenopathy.  Skin:    General: Skin is warm and dry.  Neurological:     Mental Status: She is alert and oriented to person, place, and time.     Cranial Nerves: No cranial nerve deficit.     Sensory: No sensory deficit.  Psychiatric:        Mood and Affect: Mood is anxious.        Speech: Speech normal.        Thought Content: Thought content does not include homicidal or suicidal ideation. Thought content does not  include homicidal or suicidal plan.      ED Treatments / Results  Labs (all labs ordered are listed, but only abnormal results are displayed) Labs Reviewed  BASIC METABOLIC PANEL - Abnormal; Notable for the following components:      Result Value  Glucose, Bld 105 (*)    Calcium 8.7 (*)    All other components within normal limits  CBC - Abnormal; Notable for the following components:   WBC 11.0 (*)    All other components within normal limits  POC URINE PREG, ED  TROPONIN I (HIGH SENSITIVITY)  TROPONIN I (HIGH SENSITIVITY)    EKG None  Radiology Dg Chest Port 1 View  Result Date: 09/03/2019 CLINICAL DATA:  Shortness of breath, upper back pain and dizziness for 2 days, former smoker EXAM: PORTABLE CHEST 1 VIEW COMPARISON:  Radiograph July 08, 2017 FINDINGS: No consolidation, features of edema, pneumothorax, or effusion. Pulmonary vascularity is normally distributed. The cardiomediastinal contours are unremarkable. No acute osseous or soft tissue abnormality. IMPRESSION: No acute cardiopulmonary abnormality. Electronically Signed   By: Kreg ShropshirePrice  DeHay M.D.   On: 09/03/2019 23:27    Procedures Procedures (including critical care time)  Medications Ordered in ED Medications  LORazepam (ATIVAN) tablet 1 mg (has no administration in time range)  acetaminophen (TYLENOL) tablet 1,000 mg (has no administration in time range)     Initial Impression / Assessment and Plan / ED Course  I have reviewed the triage vital signs and the nursing notes.  Pertinent labs & imaging results that were available during my care of the patient were reviewed by me and considered in my medical decision making (see chart for details).          Final Clinical Impressions(s) / ED Diagnoses MDM  Blood pressure slightly elevated at 147/89, otherwise vital signs are within normal limits.  Pulse oximetry is 98% on room air.  Within normal limits by my interpretation.  The patient has been having  approximately 2 days of chest pain, shortness of breath, and dizziness.  She has had these problems in the past, but they have been related to anxiety and panic.  Patient felt that this may be a little different.  The patient denies suicidal or homicidal ideations.  The basic metabolic panel is negative for acute changes.  The anion gap is normal at 7. Troponin is negative for acute event at less than 2.  The complete blood count shows the white blood cells to be slightly elevated at 11,000 otherwise normal.  Chest x-ray shows no acute cardiopulmonary abnormality.  I have reviewed the findings on the examination as well as the findings on the laboratory and x-ray testing with the patient in terms of which he understands.  No acute changes or abnormalities appreciated at this time.  I have asked the patient to discuss her anxiety and panic symptoms with her primary physician.  Patient is in agreement with this plan.  Patient will return to the emergency department if there are any worsening of her symptoms, changes in her condition, problems, or concerns.   Final diagnoses:  Chest pain of uncertain etiology  Anxiety    ED Discharge Orders    None       Ivery QualeBryant, Ninamarie Keel, PA-C 09/03/19 2355    Ivery QualeBryant, Kailen Hinkle, PA-C 09/04/19 0001    Mesner, Barbara CowerJason, MD 09/04/19 [phone]

## 2019-09-03 NOTE — Discharge Instructions (Addendum)
Your vital signs are within normal limits.  Your oxygen level is normal.  Your chest x-ray is negative for acute event.  Your heart enzymes are negative for acute event, and your electrolytes are also normal.  Please see your primary physician or return to the emergency department if there is worsening of your symptoms, changes in your general condition, problems, or concerns.

## 2021-03-02 ENCOUNTER — Other Ambulatory Visit: Payer: Self-pay

## 2021-03-02 ENCOUNTER — Ambulatory Visit (HOSPITAL_COMMUNITY)
Admission: EM | Admit: 2021-03-02 | Discharge: 2021-03-02 | Disposition: A | Discharge status: Home / Self Care | Payer: No Payment, Other | Attending: Clinical | Admitting: Clinical

## 2021-03-02 DIAGNOSIS — F411 Generalized anxiety disorder: Secondary | ICD-10-CM | POA: Insufficient documentation

## 2021-03-02 DIAGNOSIS — F331 Major depressive disorder, recurrent, moderate: Secondary | ICD-10-CM | POA: Diagnosis not present
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
## 2021-03-02 NOTE — Progress Notes (Signed)
TTS Triage: Pt to [facility] voluntarily due to worsening SI with plan to use carbon monoxide. Pt contracts for safety and appears to have good support system of her partner [name] [phone] and daughter Dorina Hoyer [phone] who lives with pt. Pt reports diagnosis of PTSD and BPD. Pt does not have outpatient services. Pt denies HI, AVH, and substance use.   Pt is emergent

## 2021-03-02 NOTE — Discharge Instructions (Addendum)
Take all medications as prescribed. Keep all follow-up appointments as scheduled.  Do not consume alcohol or use illegal drugs while on prescription medications. Report any adverse effects from your medications to your primary care provider promptly.  In the event of recurrent symptoms or worsening symptoms, call 911, a crisis hotline, or go to the nearest emergency department for evaluation.   

## 2021-03-02 NOTE — Discharge Summary (Signed)
Sonya Trujillo to be D/C home per NP order. Discussed with the patient and all questions fully answered. An After Visit Summary was printed and given to the patient. Patient escorted out and D/C home via private auto.  Sonya Trujillo  03/02/2021 2:36 PM
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
## 2021-03-03 ENCOUNTER — Telehealth (HOSPITAL_COMMUNITY): Payer: Self-pay | Admitting: Licensed Clinical Social Worker

## 2021-03-05 ENCOUNTER — Ambulatory Visit: Payer: Self-pay | Admitting: Physician Assistant

## 2021-03-05 ENCOUNTER — Other Ambulatory Visit: Payer: Self-pay

## 2021-03-05 ENCOUNTER — Other Ambulatory Visit: Payer: Self-pay | Admitting: Physician Assistant

## 2021-03-05 VITALS — BP 128/84 | HR 62 | Temp 98.7°F | Resp 18 | Ht 66.0 in | Wt 260.0 lb

## 2021-03-05 DIAGNOSIS — Z1159 Encounter for screening for other viral diseases: Secondary | ICD-10-CM

## 2021-03-05 DIAGNOSIS — R030 Elevated blood-pressure reading, without diagnosis of hypertension: Secondary | ICD-10-CM

## 2021-03-05 DIAGNOSIS — E559 Vitamin D deficiency, unspecified: Secondary | ICD-10-CM

## 2021-03-05 DIAGNOSIS — Z13228 Encounter for screening for other metabolic disorders: Secondary | ICD-10-CM

## 2021-03-05 DIAGNOSIS — Z114 Encounter for screening for human immunodeficiency virus [HIV]: Secondary | ICD-10-CM

## 2021-03-05 DIAGNOSIS — F4310 Post-traumatic stress disorder, unspecified: Secondary | ICD-10-CM

## 2021-03-05 DIAGNOSIS — F603 Borderline personality disorder: Secondary | ICD-10-CM

## 2021-03-05 DIAGNOSIS — Z1231 Encounter for screening mammogram for malignant neoplasm of breast: Secondary | ICD-10-CM

## 2021-03-05 MED ORDER — HYDROXYZINE HCL 25 MG PO TABS: 25.0000 mg | ORAL_TABLET | Freq: Three times a day (TID) | ORAL | 0 refills | Status: DC | PRN

## 2021-03-05 NOTE — Progress Notes (Signed)
Patient has not eaten today. Patient has not taken medication today. Patient denies pain at this time. Patient moved to [place] and is needing refills on anxiety medication and establish care.

## 2021-03-05 NOTE — Patient Instructions (Addendum)
I encourage you to try hydroxyzine, you can take 1/2-1 full tablet up to 3 times a day to help with elevated anxiety, you can take 1 to 2 tablets at bedtime to help with insomnia.  I do encourage you to check your blood pressure at home, keep a written log, and have available for all office visits.  Please feel free to return to the mobile medicine unit if you are experiencing elevated blood pressure readings  We will call you with your lab results.  Please let us know if there is anything else we can do for you  Roney Jaffe, PA-C Physician Assistant [facility] Medicine [email or web address]   How to Take Your Blood Pressure Blood pressure is a measurement of how strongly your blood is pressing against the walls of your arteries. Arteries are blood vessels that carry blood from your heart throughout your body. Your health care provider takes your blood pressure at each office visit. You can also take your own blood pressure at home with a blood pressure monitor. You may need to take your own blood pressure to:  Confirm a diagnosis of high blood pressure (hypertension).  Monitor your blood pressure over time.  Make sure your blood pressure medicine is working. Supplies needed:  Blood pressure monitor.  Dining room chair to sit in.  Table or desk.  Small notebook and pencil or pen. How to prepare To get the most accurate reading, avoid the following for 30 minutes before you check your blood pressure:  Drinking caffeine.  Drinking alcohol.  Eating.  Smoking.  Exercising. Five minutes before you check your blood pressure:  Use the bathroom and urinate so that you have an empty bladder.  Sit quietly in a dining room chair. Do not sit in a soft couch or an armchair. Do not talk. How to take your blood pressure To check your blood pressure, follow the instructions in the manual that came with your blood pressure monitor. If  you have a digital blood pressure monitor, the instructions may be as follows: 1. Sit up straight in a chair. 2. Place your feet on the floor. Do not cross your ankles or legs. 3. Rest your left arm at the level of your heart on a table or desk or on the arm of a chair. 4. Pull up your shirt sleeve. 5. Wrap the blood pressure cuff around the upper part of your left arm, 1 inch (2.5 cm) above your elbow. It is best to wrap the cuff around bare skin. 6. Fit the cuff snugly around your arm. You should be able to place only one finger between the cuff and your arm. 7. Position the cord so that it rests in the bend of your elbow. 8. Press the power button. 9. Sit quietly while the cuff inflates and deflates. 10. Read the digital reading on the monitor screen and write the numbers down (record them) in a notebook. 11. Wait 2-3 minutes, then repeat the steps, starting at step 1.   What does my blood pressure reading mean? A blood pressure reading consists of a higher number over a lower number. Ideally, your blood pressure should be below 120/80. The first ("top") number is called the systolic pressure. It is a measure of the pressure in your arteries as your heart beats. The second ("bottom") number is called the diastolic pressure. It is a measure of the pressure in your arteries as the heart relaxes. Blood pressure is classified into five stages. The  following are the stages for adults who do not have a short-term serious illness or a chronic condition. Systolic pressure and diastolic pressure are measured in a unit called mm Hg (millimeters of mercury).  Normal  Systolic pressure: below 120.  Diastolic pressure: below 80. Elevated  Systolic pressure: 120-129.  Diastolic pressure: below 80. Hypertension stage 1  Systolic pressure: 130-139.  Diastolic pressure: 80-89. Hypertension stage 2  Systolic pressure: 140 or above.  Diastolic pressure: 90 or above. You can have elevated blood  pressure or hypertension even if only the systolic or only the diastolic number in your reading is higher than normal. Follow these instructions at home:  Check your blood pressure as often as recommended by your health care provider.  Check your blood pressure at the same time every day.  Take your monitor to the next appointment with your health care provider to make sure that: ? You are using it correctly. ? It provides accurate readings.  Be sure you understand what your goal blood pressure numbers are.  Tell your health care provider if you are having any side effects from blood pressure medicine.  Keep all follow-up visits as told by your health care provider. This is important. General tips  Your health care provider can suggest a reliable monitor that will meet your needs. There are several types of home blood pressure monitors.  Choose a monitor that has an arm cuff. Do not choose a monitor that measures your blood pressure from your wrist or finger.  Choose a cuff that wraps snugly around your upper arm. You should be able to fit only one finger between your arm and the cuff.  You can buy a blood pressure monitor at most drugstores or online. Where to find more information American Heart Association: www.heart.org Contact a health care provider if:  Your blood pressure is consistently high. Get help right away if:  Your systolic blood pressure is higher than 180.  Your diastolic blood pressure is higher than 120. Summary  Blood pressure is a measurement of how strongly your blood is pressing against the walls of your arteries.  A blood pressure reading consists of a higher number over a lower number. Ideally, your blood pressure should be below 120/80.  Check your blood pressure at the same time every day.  Avoid caffeine, alcohol, smoking, and exercise for 30 minutes prior to checking your blood pressure. These agents can affect the accuracy of the blood pressure  reading. This information is not intended to replace advice given to you by your health care provider. Make sure you discuss any questions you have with your health care provider. Document Revised: 11/16/2019 Document Reviewed: 11/16/2019 Elsevier Patient Education  2021 [employer].
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
## 2021-03-06 ENCOUNTER — Other Ambulatory Visit: Payer: Self-pay | Admitting: Physician Assistant

## 2021-03-06 DIAGNOSIS — F603 Borderline personality disorder: Secondary | ICD-10-CM | POA: Insufficient documentation

## 2021-03-06 DIAGNOSIS — F4310 Post-traumatic stress disorder, unspecified: Secondary | ICD-10-CM | POA: Insufficient documentation

## 2021-03-06 DIAGNOSIS — R030 Elevated blood-pressure reading, without diagnosis of hypertension: Secondary | ICD-10-CM | POA: Insufficient documentation

## 2021-03-06 DIAGNOSIS — E559 Vitamin D deficiency, unspecified: Secondary | ICD-10-CM | POA: Insufficient documentation

## 2021-03-06 LAB — LIPID PANEL
Chol/HDL Ratio: 4.4 ratio (ref 0.0–4.4)
Cholesterol, Total: 177 mg/dL (ref 100–199)
HDL: 40 mg/dL (ref >39)
LDL Chol Calc (NIH): 123 mg/dL — ABNORMAL HIGH (ref 0–99)
Triglycerides: 72 mg/dL (ref 0–149)
VLDL Cholesterol Cal: 14 mg/dL (ref 5–40)

## 2021-03-06 LAB — CBC WITH DIFFERENTIAL/PLATELET
Basophils Absolute: 0.1 10*3/uL (ref 0.0–0.2)
Basos: 1 %
EOS (ABSOLUTE): 0.2 10*3/uL (ref 0.0–0.4)
Eos: 3 %
Hematocrit: 44.7 % (ref 34.0–46.6)
Hemoglobin: 15.1 g/dL (ref 11.1–15.9)
Immature Grans (Abs): 0 10*3/uL (ref 0.0–0.1)
Immature Granulocytes: 0 %
Lymphocytes Absolute: 1.9 10*3/uL (ref 0.7–3.1)
Lymphs: 29 %
MCH: 30.8 pg (ref 26.6–33.0)
MCHC: 33.8 g/dL (ref 31.5–35.7)
MCV: 91 fL (ref 79–97)
Monocytes Absolute: 0.6 10*3/uL (ref 0.1–0.9)
Monocytes: 8 %
Neutrophils Absolute: 3.8 10*3/uL (ref 1.4–7.0)
Neutrophils: 59 %
Platelets: 242 10*3/uL (ref 150–450)
RBC: 4.9 x10E6/uL (ref 3.77–5.28)
RDW: 12.4 % (ref 11.7–15.4)
WBC: 6.5 10*3/uL (ref 3.4–10.8)

## 2021-03-06 LAB — COMP. METABOLIC PANEL (12)
AST: 15 IU/L (ref 0–40)
Albumin/Globulin Ratio: 1.8 (ref 1.2–2.2)
Albumin: 3.9 g/dL (ref 3.8–4.8)
Alkaline Phosphatase: 46 IU/L (ref 44–121)
BUN/Creatinine Ratio: 10 (ref 9–23)
BUN: 9 mg/dL (ref 6–24)
Bilirubin Total: 0.5 mg/dL (ref 0.0–1.2)
Calcium: 9.2 mg/dL (ref 8.7–10.2)
Chloride: 106 mmol/L (ref 96–106)
Creatinine, Ser: 0.86 mg/dL (ref 0.57–1.00)
Globulin, Total: 2.2 g/dL (ref 1.5–4.5)
Glucose: 106 mg/dL — ABNORMAL HIGH (ref 65–99)
Potassium: 4.3 mmol/L (ref 3.5–5.2)
Sodium: 138 mmol/L (ref 134–144)
Total Protein: 6.1 g/dL (ref 6.0–8.5)
eGFR: 85 mL/min/{1.73_m2} (ref >59)

## 2021-03-06 LAB — VITAMIN D 25 HYDROXY (VIT D DEFICIENCY, FRACTURES): Vit D, 25-Hydroxy: 11.7 ng/mL — ABNORMAL LOW (ref 30.0–100.0)

## 2021-03-06 LAB — TSH: TSH: 1.06 u[IU]/mL (ref 0.450–4.500)

## 2021-03-06 LAB — HCV INTERPRETATION

## 2021-03-06 LAB — HCV AB W/RFLX TO VERIFICATION: HCV Ab: <0.1 s/co ratio (ref 0.0–0.9)

## 2021-03-06 LAB — HIV ANTIBODY (ROUTINE TESTING W REFLEX): HIV Screen 4th Generation wRfx: NONREACTIVE

## 2021-03-06 MED ORDER — VITAMIN D (ERGOCALCIFEROL) 1.25 MG (50000 UNIT) PO CAPS: 50000.0000 [IU] | ORAL_CAPSULE | ORAL | 2 refills | Status: DC

## 2021-03-06 MED FILL — VIT D2 1.25 MG (50,000 UNIT: 1.25 MG | 28 days supply | Qty: 4 | Fill #0

## 2021-03-06 MED FILL — hydrOXYzine HCL 25 MG TABS: 25 | 10 days supply | Qty: 30 | Fill #0

## 2021-03-06 NOTE — Addendum Note (Signed)
Addended by: Roney Jaffe on: 03/06/2021 11:50 AM   Modules accepted: Orders

## 2021-03-09 ENCOUNTER — Encounter (HOSPITAL_COMMUNITY): Payer: Self-pay

## 2021-03-09 ENCOUNTER — Ambulatory Visit (INDEPENDENT_AMBULATORY_CARE_PROVIDER_SITE_OTHER): Payer: No Payment, Other | Admitting: Licensed Clinical Social Worker

## 2021-03-09 DIAGNOSIS — F4310 Post-traumatic stress disorder, unspecified: Secondary | ICD-10-CM

## 2021-03-09 DIAGNOSIS — F603 Borderline personality disorder: Secondary | ICD-10-CM | POA: Diagnosis not present

## 2021-03-09 NOTE — Progress Notes (Signed)
Spoke with patient via Webex video call, used 2 identifiers to correctly identify patient. This is her first time in [facility] after she went to [facility] to be seen for having suicidal thoughts. Has been having a lot of anxiety when she has to leave the house and do day to day activities such as grocery shopping. Panic attacks happen about twice a month. Feeling out of control. She is in school for an associates of science degree but has had to drop Biology twice due to anxiety. She became tearful when talking about it stating she really want to take the class and finish school. Never had an inpatient stay. Has had ringing in her ears constantly and will be seeing a physician about it soon. Denies SI/HI or AV hallucinations. On scale 1-10 as 10 being worst she rates depression at 4 and anxiety at 4. PHQ9=19. No other issues or complaints. Groups are going well so far.

## 2021-03-09 NOTE — Psych (Signed)
[facility] PHP THERAPIST PROGRESS NOTE  Sonya Trujillo [id number]   Virtual Visit via Video Note  I connected with Sonya Trujillo 04/04/22at9:30 AM ESTby a video enabled telemedicine application and verified that I am speaking with the correct person using two identifiers.  Location: Patient:Home Provider:Office  Patient participated in daily group therapy session.     Session Time: 9:00AM - Check In  Participation Level: Active  Behavioral Response: CasualAlertNA  Type of Therapy: Group Therapy  Treatment Goals addressed: Coping  Interventions: CBT, DBT, Solution Focused, Strength-based and Supportive  Summary: Sonya Trujillo is a 45 y.o. female who presents with a previous psychiatric history of Borderline Personality Disorder and PTSD.   Suicidal/Homicidal: Nowithout intent/plan  Therapist Response: Reports having a pretty good and quiet weekend. She reports she feels a little anxious beginning group. Sonya Trujillo rated her mood at a 7 this morning. She denied having any SI, HI, AVH.    Diagnosis: Borderline personality disorder (HCC) [F60.3]    1. Borderline personality disorder (HCC)   2. PTSD (post-traumatic stress disorder)     Session Time: 10:00AM - Exploring Values & Goals  Participation Level: Active  Behavioral Response: CasualAlertNA  Type of Therapy: Group Therapy  Treatment Goals addressed: Coping  Interventions: CBT, DBT, Solution Focused, Strength-based and Supportive  Suicidal/Homicidal: Nowithout intent/plan  Therapist Response: Values: Sonya Trujillo reports that the values that are most important to her are Air traffic controller". Sonya Trujillo states that she uses to be very selfish and self-centered, versus now she is very concerned about her loved ones and focusing on their wellbeing. Sonya Trujillo states that as she matured and began to create a family, her perspective changed. Sonya Trujillo shared that her value differs from family and  mainstream society due to her having more tolerance, inclusion, and compassion for others. She also shared that her religious beliefs are different for family as well.  Sonya Trujillo completed a worksheet that challenged her to identify/explore her values, rate them and then explain what things could be done to make each category more important in her life.     Goals:  Patients discussed how their values reflect in their goals. Patients completed a work sheet regarding Goal Exploration and were challenged to identify a 5year, 1year and 67month goal and how they can set tasks and objectives to accomplish these goals in the following categories: Social, Event organiser, Physical, Family, Leisure Activities & Personality.      Session Time: 11:00AM - Problem Solving Techniques  Participation Level: Active  Behavioral Response: CasualAlertNA  Type of Therapy: Group Therapy  Treatment Goals addressed: Coping  Interventions: CBT, DBT, Solution Focused, Strength-based and Supportive  Suicidal/Homicidal: Nowithout intent/plan  Therapist Response: Patients reviewed the 7 Steps of of Problem Solving: Identify the Problem, Setting the goal; Brainstorming options; Weighing the Pros and Cons; Selecting the best option, Creating an action plan and Evaluating the outcome. They also learned about different problem solving orientations (positive or negative) and the 3 problem solving styles (avoidant, impulsivity and rational).  Panic attacks are her main problem. Causes - past trauma, old coping skills, dysregulation of emotions, inability to see things as they are in that moment. Keeps me from fully participating in life.   Solutions: Medication management; Meditation/Healthy coping mechanisms; Desensitization to triggers      Session Time: 12:00PM - Overall Wellness   Participation Level: Active  Behavioral Response: CasualAlertNA  Type of Therapy: Group Therapy  Treatment Goals addressed:  Coping  Interventions: CBT, DBT, Solution Focused, Strength-based and Supportive  Suicidal/Homicidal: Nowithout intent/plan  Therapist Response: Patients reviewed the 8 dimensions of wellness: Emotional, Intellectual, Spiritual, Physical, Occupational, Financial, Environmental & Social. Patients identified areas in which they could improve their wellness lifestyle and how they could so.     Session Time: 12:45PM - Wrap Up/ End of Group   Participation Level: Active  Behavioral Response: CasualAlertNA  Type of Therapy: Group Therapy  Treatment Goals addressed: Coping  Interventions: CBT, DBT, Solution Focused, Strength-based and Supportive  Suicidal/Homicidal: Nowithout intent/plan  Therapist Response: Sonya Trujillo reports she felt good today at the end of group. She reports her first group session was successful and beneficial. Sonya Trujillo denied having any SI, HI, AVH. Sonya Trujillo reports she will be in attendance for tomorrow's group session.     [facility]-PHP THERAPIST 03/09/2021

## 2021-03-10 ENCOUNTER — Other Ambulatory Visit: Payer: Self-pay

## 2021-03-10 ENCOUNTER — Ambulatory Visit (INDEPENDENT_AMBULATORY_CARE_PROVIDER_SITE_OTHER): Payer: No Payment, Other

## 2021-03-10 DIAGNOSIS — F603 Borderline personality disorder: Secondary | ICD-10-CM | POA: Diagnosis not present

## 2021-03-10 DIAGNOSIS — F4310 Post-traumatic stress disorder, unspecified: Secondary | ICD-10-CM | POA: Diagnosis not present
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
## 2021-03-10 NOTE — Addendum Note (Signed)
Addended by: Oneta Rack on: 03/10/2021 12:23 PM   Modules accepted: Orders, SmartSet

## 2021-03-11 ENCOUNTER — Other Ambulatory Visit: Payer: Self-pay

## 2021-03-11 ENCOUNTER — Ambulatory Visit (INDEPENDENT_AMBULATORY_CARE_PROVIDER_SITE_OTHER): Payer: No Payment, Other | Admitting: Licensed Clinical Social Worker

## 2021-03-11 DIAGNOSIS — F4310 Post-traumatic stress disorder, unspecified: Secondary | ICD-10-CM | POA: Diagnosis not present

## 2021-03-11 DIAGNOSIS — F603 Borderline personality disorder: Secondary | ICD-10-CM | POA: Diagnosis not present
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
## 2021-03-11 NOTE — Psych (Addendum)
   [facility] BH PHP THERAPIST PROGRESS NOTE  Leslie Jester [id number]   Virtual Visit via Video Note  I connected with Caryl BARNESon 04/05/22at9:30 AM ESTby a video enabled telemedicine application and verified that I am speaking with the correct person using two identifiers.  Location: Patient:Home Provider:Office  Patient participated in daily group therapy session.     Session Time: 9:00AM - Check In  Participation Level: Active  Behavioral Response: CasualAlertNA  Type of Therapy: Group Therapy  Treatment Goals addressed: Coping  Interventions: CBT, DBT, Solution Focused, Strength-based and Supportive  Summary: Sonya Trujillo is a 45 y.o. female who presents with a previous psychiatric history of Borderline Personality Disorder and PTSD.   Suicidal/Homicidal: Nowithout intent/plan  Therapist Response: Rated her mood at a 9 this morning. She shared that yesterday's afternoon was slightly stressful, however she was able to remain calm and got a good night's rest. She denied any SI, HI, AVH.    Diagnosis: Borderline personality disorder (HCC) [F60.3]    1. Borderline personality disorder (HCC)   2. PTSD (post-traumatic stress disorder)     Session Time: 10:00AM - Understanding Anger  Participation Level: Active  Behavioral Response: CasualAlertNA  Type of Therapy: Group Therapy  Treatment Goals addressed: Coping  Interventions: CBT, DBT, Solution Focused, Strength-based and Supportive  Suicidal/Homicidal: Nowithout intent/plan  Therapist Response: Kaydence states that it is important to understand anger because it "is destructive in every aspect- spiritual, emotional, physical, etc. "  Tessy shared a time when her anger caught her by surprise. She states she becomes angry while cleaning and washing dishes at home because it triggers emotions of feeling unappreciated, taken advantage of.   Montez identified the following anger warning signs she  current experiences: Body shaking Turning red Throwing things Staring aggressively  Raise my voice Shut down "Become quiet"     Session Time: 11:00AM - Chaplain Group   Participation Level: Active  Behavioral Response: CasualAlertNA  Type of Therapy: Group Therapy  Treatment Goals addressed: Coping  Interventions: CBT, DBT, Solution Focused, Strength-based and Supportive  Suicidal/Homicidal: Nowithout intent/plan  Therapist Response:  Patient was engaged and participated throughout. Please see Chaplain's note.      Session Time: 12:00PM - Anger Management Techniques   Participation Level: Active  Behavioral Response: CasualAlertNA  Type of Therapy: Group Therapy  Treatment Goals addressed: Coping  Interventions: CBT, DBT, Solution Focused, Strength-based and Supportive  Suicidal/Homicidal: Nowithout intent/plan  Therapist Response:  Patients reviewed anger management techniques that include Deep Breathing Exercises, Time-Outs, Cognitive Restructuring (A.B.C.D model), Identifying Warning Signs and utilizing Diversions.     Session Time: 12:45PM - Wrap Up/ End of Group   Participation Level: Active  Behavioral Response: CasualAlertNA  Type of Therapy: Group Therapy  Treatment Goals addressed: Coping  Interventions: CBT, DBT, Solution Focused, Strength-based and Supportive  Suicidal/Homicidal: Nowithout intent/plan  Therapist Response: Lizbett reports feeling good at the end of group. She rated her mood at a 9. She states that she does not have any plans this afternoon. She denied having any SI, HI, AVH and plans to be in attendance for tomorrow's group session.    [facility]-PHP THERAPIST 03/11/2021

## 2021-03-12 ENCOUNTER — Other Ambulatory Visit: Payer: Self-pay

## 2021-03-12 ENCOUNTER — Ambulatory Visit (INDEPENDENT_AMBULATORY_CARE_PROVIDER_SITE_OTHER): Payer: No Payment, Other | Admitting: Licensed Clinical Social Worker

## 2021-03-12 DIAGNOSIS — F4310 Post-traumatic stress disorder, unspecified: Secondary | ICD-10-CM | POA: Diagnosis not present

## 2021-03-12 DIAGNOSIS — F603 Borderline personality disorder: Secondary | ICD-10-CM | POA: Diagnosis not present

## 2021-03-12 NOTE — Psych (Signed)
[facility] BH PHP THERAPIST PROGRESS NOTE  Tayna Smethurst [id number]   Virtual Visit via Video Note  I connected with Sonya BARNESon 04/07/22at9:30 AM ESTby a video enabled telemedicine application and verified that I am speaking with the correct person using two identifiers.  Location: Patient:Home Provider:Office  Patient participated in daily group therapy session.     Session Time: 9:00AM - Check In  Participation Level: Active  Behavioral Response: CasualAlertNA  Type of Therapy: Group Therapy  Treatment Goals addressed: Coping  Interventions: CBT, DBT, Solution Focused, Strength-based and Supportive  Summary: Sonya Trujillo is a 45 y.o. female who presents with a previous psychiatric history of Borderline Personality Disorder and PTSD.   Suicidal/Homicidal: Nowithout intent/plan  Therapist Response: Reports she is doing "alright" this morning. She shared that she had a plasma donation yesterday after group and when she returned home, she "I stayed to myself" because she felt overwhelmed. Sonya Trujillo rated her mood at a 7 this morning. Shew denied having any SI, HI, AVH.        Diagnosis: Borderline personality disorder (HCC) [F60.3]    1. Borderline personality disorder (HCC)   2. PTSD (post-traumatic stress disorder)     Session Time: 10:00AM - What is Anxiety?   Participation Level: Active  Behavioral Response: CasualAlertNA  Type of Therapy: Group Therapy  Treatment Goals addressed: Coping  Interventions: CBT, DBT, Solution Focused, Strength-based and Supportive  Suicidal/Homicidal: Nowithout intent/plan  Therapist Response: Shared that she learned she struggled with anxiety last year when she had a severe anxiety attack that felt like a heart attack. States that she now can identify when her anxiety increases due to the associated symptoms, which are: irritability, nauseas, coughing, gagging, sweaty are some of the symptoms that she experiences  when she feels anxious. Patients discussed and defined what anxiety is, in addition to identifying symptoms and potential treatments. Patients reviewed the Fight of Flight Response mechanism. Sonya Trujillo shared "I live in fight or flight". She states that she is easily startled, and her immediate reaction is "fist up" or fight versus "flight". Reports she is working through that. She reports her father was very intentional on teaching her how to fight and she has always been a "protective person"   Patients reviewed the "Cycle of Anxiety". After reviewing, Sonya Trujillo shared that she struggled with anxiety for year, however, recently became aware due to similar symptoms that were expressed in the video regarding the cycle of anxiety.   Sonya Trujillo completed a worksheet that challenges the individual's anxiety by addressing irrational thought patterns, outcomes and the importance of various situations that could trigger increased anxiety.    Session Time: 11:00AM - The Effects of Anxiety   Participation Level: Active  Behavioral Response: CasualAlertNA  Type of Therapy: Group Therapy  Treatment Goals addressed: Coping  Interventions: CBT, DBT, Solution Focused, Strength-based and Supportive  Suicidal/Homicidal: Nowithout intent/plan  Therapist Response:  Sonya Trujillo reviewed the effects anxiety has on the body, the central nervous system, immune system, cardiovascular system, and respiratory symptoms. Patient discussed and reviewed various forms/types of anxiety, which included:  Generalized Anxiety Disorder Social anxiety PTSD Obsessive-compulsive disorder Phobias Panic attacks  Sonya Trujillo also discussed and reviewed the "Neuroscience of Anxiety" and the effects it has on the brain and more detailed information regarding panic attacks. Sonya Trujillo shared personal experiences with her peers and contributed to the group's discussion.     Session Time: 12:00PM - Coping & Relaxation Techniques    Participation Level: Active  Behavioral Response: CasualAlertNA  Type  of Therapy: Group Therapy  Treatment Goals addressed: Coping  Interventions: CBT, DBT, Solution Focused, Strength-based and Supportive  Suicidal/Homicidal: Nowithout intent/plan  Therapist Response: Sonya Trujillo reviewed various coping mechanisms and treatment options for anxiety. The specific treatment options discussed were Medication management, Cognitive behavioral therapy (CBT), Exposure therapy and relaxation techniques.   Sonya Trujillo also reviewed and discussed relaxation techniques that assist with managing anxiety. Those techniques were progressive muscle relaxation, deep breathing, imagery, and challenging irrational thoughts (cognitive distortions).   Concluded by watching Ted[employer] video " How to Replace Anxiety with Purpose" by Isidore Moos.    Session Time: 12:45PM - Wrap Up/ End of Group   Participation Level: Active  Behavioral Response: CasualAlertNA  Type of Therapy: Group Therapy  Treatment Goals addressed: Coping  Interventions: CBT, DBT, Solution Focused, Strength-based and Supportive  Suicidal/Homicidal: Nowithout intent/plan  Therapist Response: Reports being "touched" by the TedTalk video "How to Replace Anxiety with Purpose".  Sonya Trujillo states that she was able to resonate with many of the points the presenter shared during the video. Sonya Trujillo states that she feels good at the end of group. She continues to rate her mood at a 7, however she shared it was a "better 7". Sonya Trujillo reports that she only plans to complete some schoolwork and rest this afternoon. Sonya Trujillo denied having any SI, HI, AVH at this time.     [facility]-PHP THERAPIST 03/12/2021

## 2021-03-13 ENCOUNTER — Other Ambulatory Visit: Payer: Self-pay

## 2021-03-13 ENCOUNTER — Ambulatory Visit (INDEPENDENT_AMBULATORY_CARE_PROVIDER_SITE_OTHER): Payer: No Payment, Other | Admitting: Licensed Clinical Social Worker

## 2021-03-13 DIAGNOSIS — F603 Borderline personality disorder: Secondary | ICD-10-CM | POA: Diagnosis not present

## 2021-03-13 DIAGNOSIS — F4310 Post-traumatic stress disorder, unspecified: Secondary | ICD-10-CM

## 2021-03-13 NOTE — Psych (Signed)
[facility] BH PHP THERAPIST PROGRESS NOTE  Etsuko Dierolf [id number]   Virtual Visit via Video Note  I connected with Aris BARNESon 04/08/22at9:30 AM ESTby a video enabled telemedicine application and verified that I am speaking with the correct person using two identifiers.  Location: Patient:Home Provider:Office  Patient participated in daily group therapy session.     Session Time: 9:00AM - Check In  Participation Level: Active  Behavioral Response: CasualAlertNA  Type of Therapy: Group Therapy  Treatment Goals addressed: Coping  Interventions: CBT, DBT, Solution Focused, Strength-based and Supportive  Summary: Navaeh Kehres is a 45 y.o. female who presents with a previous psychiatric history of Borderline Personality Disorder and PTSD.   Suicidal/Homicidal: Nowithout intent/plan  Therapist Response: Georgianne reports feeling good. Kasarah rated her mood at a 9 this morning. Shared that she had an unexpected blessing this morning. She denied having SI, HI, AVH.     Diagnosis: Borderline personality disorder (HCC) [F60.3]    1. Borderline personality disorder (HCC)   2. PTSD (post-traumatic stress disorder)     Session Time: 10:00AM - Identifying Healthy vs. Unhealthy Relationships   Participation Level: Active  Behavioral Response: CasualAlertNA  Type of Therapy: Group Therapy  Treatment Goals addressed: Coping  Interventions: CBT, DBT, Solution Focused, Strength-based and Supportive  Suicidal/Homicidal: Nowithout intent/plan  Therapist Response: Teaghan shared that she could recognize what healthy relationships are due to multiple experiences with unhealthy relationships. She states that she can cultivate healthier relationships now that she is more mature and have more knowledge about what a healthy relationship is. Jonetta reports that some common characteristics of a healthy relationships is "having a mutual respect and having a safe space to  communicate". Miyo reports that some common characteristics for unhealthy relationships - gaslighting (trigger for her), narcissistic behaviors; not being genuine.    Leyda states that the benefits of having a healthy relationship are lack of stress/pressure; you can be yourself, you have control of your own life, your opinion matters; everyone deserves to be treated like a human being. She identified factors that impact relationships, which are: family; work Counselling psychologist; friends, and previous interests. Iley identified her own love languages: Acts of service, words of affirmation and receiving gifts.     Session Time: 11:00AM - Establishing Boundaries   Participation Level: Active  Behavioral Response: CasualAlertNA  Type of Therapy: Group Therapy  Treatment Goals addressed: Coping  Interventions: CBT, DBT, Solution Focused, Strength-based and Supportive  Suicidal/Homicidal: Nowithout intent/plan  Therapist Response: Reports that she has improved in her boundary setting, however she believes she can still approve.   Rainbow reviewed with the group, the 3 categories of boundaries (Rigid, Porous, Healthy). Talula reports she has a mix of the different boundary types. She shared that she has rigid boundaries with people she does not know or who are new in her life, however has porous boundaries with closer relationships, examples -  her partner and daughters.   Allsion completed a Boundary Exploration worksheet that challenges them to explore what individuals/groups they struggle establishing healthy boundaries with, while also identifying how they could improve their rigid or porous boundaries into healthier alternatives. She reports that she has rigid boundaries with her housemates. After discussion with the group and exploring alternatives to establishing boundaries in complex situations, Julea shared that she plans to improve those boundaries by becoming more affectionate,  being more open about feelings, being vulnerable; setting limits on the use of her possessions, carving out more time for self-care, improving time management  skills and learning to say no.   Session Time: 12:00PM - Effective Communication   Participation Level: Active  Behavioral Response: CasualAlertNA  Type of Therapy: Group Therapy  Treatment Goals addressed: Coping  Interventions: CBT, DBT, Solution Focused, Strength-based and Supportive  Suicidal/Homicidal: Nowithout intent/plan  Therapist Response: Reports important because it gives perspective, and it allows you to have a better understanding of what is going on within the relationship. [name] reviewed and discussed with the group the importance of effective communication within all relationships. She reviewed active listening skills, the different styles of communication (passive, aggressive and assertive) and concluded the session by watching a TedTalk video by Barnetta Chapel named "The Art of Effective Communication".    Session Time: 12:45PM - Wrap Up/ End of Group   Participation Level: Active  Behavioral Response: CasualAlertNA  Type of Therapy: Group Therapy  Treatment Goals addressed: Coping  Interventions: CBT, DBT, Solution Focused, Strength-based and Supportive  Suicidal/Homicidal: Nowithout intent/plan  Therapist Response: Rated her mood at a 9 by the end of group. She shared that she does not have many plans this weekend, however she does plans to join another support group for further relaxation. Jairy denied having any SI, HI or AVH at this time and reports she will be returning to group on Monday.      [facility]-PHP THERAPIST 03/13/2021

## 2021-03-16 ENCOUNTER — Telehealth: Payer: Self-pay | Admitting: *Deleted

## 2021-03-16 ENCOUNTER — Other Ambulatory Visit: Payer: Self-pay

## 2021-03-16 ENCOUNTER — Ambulatory Visit (INDEPENDENT_AMBULATORY_CARE_PROVIDER_SITE_OTHER): Payer: No Payment, Other | Admitting: Licensed Clinical Social Worker

## 2021-03-16 DIAGNOSIS — F603 Borderline personality disorder: Secondary | ICD-10-CM

## 2021-03-16 DIAGNOSIS — F4310 Post-traumatic stress disorder, unspecified: Secondary | ICD-10-CM

## 2021-03-16 MED ORDER — HYDROXYZINE HCL 25 MG PO TABS
25.0000 mg | ORAL_TABLET | Freq: Three times a day (TID) | ORAL | 0 refills | Status: DC | PRN
  Filled 2021-03-19: qty 90, 30d supply, fill #0

## 2021-03-16 NOTE — Telephone Encounter (Signed)
-----   Message from Roney Jaffe, [place] sent at 03/06/2021 11:49 AM EDT ----- Please call patient and let her know that her thyroid function, kidney function and liver function are within normal limits.  Her screening for HIV and hepatitis C were negative.  She does not show signs of anemia.  Her fasting blood glucose level was slightly elevated, she should work on a low sugar diet, and have this retested at her next office visit.  Her cholesterol overall is within normal limits, however her LDL is elevated.  Her risk of a cardiovascular event in the next 10 years is very low, she does not need to start medication at this time, however it is very important that she follow a low-cholesterol diet and have this rechecked at least yearly.   Her vitamin D level is very low, she needs to take 50,000 units once a week for the next 12 weeks and have it rechecked at that time.  Prescription sent to her pharmacy.  The 10-year ASCVD risk score Denman George DC Montez Hageman., et al., 2013) is: 1%   Values used to calculate the score:     Age: 45 years     Sex: Female     Is Non-Hispanic African American: No     Diabetic: No     Tobacco smoker: No     Systolic Blood Pressure: 128 mmHg     Is BP treated: No     HDL Cholesterol: 40 mg/dL     Total Cholesterol: 177 mg/dL

## 2021-03-16 NOTE — Psych (Signed)
[facility] BH PHP THERAPIST PROGRESS NOTE  Sonya Trujillo [id number]   Virtual Visit via Video Note  I connected with Sonya Trujillo 04/11/22at9:30 AM ESTby a video enabled telemedicine application and verified that I am speaking with the correct person using two identifiers.  Location: Patient:Home Provider:Office  Patient participated in daily group therapy session.     Session Time: 9:00AM - Check In  Participation Level: Active  Behavioral Response: CasualAlertNA  Type of Therapy: Group Therapy  Treatment Goals addressed: Coping  Interventions: CBT, DBT, Solution Focused, Strength-based and Supportive  Summary: Sonya Trujillo is a 45 y.o. female who presents with a previous psychiatric history of Borderline Personality Disorder and PTSD.   Suicidal/Homicidal: Nowithout intent/plan  Therapist Response: Reports having a hectic, but good weekend. Sonya Trujillo shared that she had a lot of things to take care of over the weekend, in addition to watching her granddaughter. Sonya Trujillo also shared that she had the opportunity to meet her newborn grandson for the first time Saturday. Sonya Trujillo states that she feels good this morning, however, did report feeling sluggish due to a migraine she had last night. Denied having any SI, HI, AVH.     Diagnosis: Borderline personality disorder (HCC) [F60.3]    1. Borderline personality disorder (HCC)   2. PTSD (post-traumatic stress disorder)     Session Time: 10:00AM - What is Emotional Regulation?   Participation Level: Active  Behavioral Response: CasualAlertNA  Type of Therapy: Group Therapy  Treatment Goals addressed: Coping  Interventions: CBT, DBT, Solution Focused, Strength-based and Supportive  Suicidal/Homicidal: Nowithout intent/plan  Therapist Response: Sonya Trujillo was prompted with the question, "When you hear emotional regulation, what comes to mind?" Sonya Trujillo responded by stating "Being in control or aware of your  emotions." Sonya Trujillo reports she could improve on her emotional regulations skills, however they are better than what they were in the past. Reports she had zero control of managing any of her emotions.   Sonya Trujillo shared that she learned how to manage her emotions, initially from her parents. She shared that her parents had a negative influence on her emotional regulation skills due to their aggressiveness and angry outbursts. She shared that her father was a very aggressive and angry person, and she believes she "picked up" on some of his traits. Sonya Trujillo also stated that culture/society plays a major role in how well individuals emotional intelligence and regulation skills are. She states that in our society it is "looked down" to be vulnerable and to be "emotional", so many people learn to hold their feelings inside and/or have more aggressive approaches to different circumstances and stressors they may encounter.   Sonya Trujillo Trujillo and watched an educational video on emotional intelligence with the group. She also processed different emotions and the associated signs and behaviors for each. Sonya Trujillo was able to identify a time when she lacked emotional regulation, that led to unwanted consequences. She shared how her feelings of worry and nervousness can lead to anxiety/panic attacks. She reports she has lost jobs and had to leave school due to not being able to manage her emotions that trigger those episodes.     Session Time: 11:00AM - Emotional Regulation & Distress Tolerance Skills   Participation Level: Active  Behavioral Response: CasualAlertNA  Type of Therapy: Group Therapy  Treatment Goals addressed: Coping  Interventions: CBT, DBT, Solution Focused, Strength-based and Supportive  Suicidal/Homicidal: Nowithout intent/plan  Therapist Response:  Sonya Trujillo watched an educational video on DBT Skills: Civil engineer, contracting. She reviewed the  following emotional regulation and  distress tolerance skills:  Opposite Action Check the Facts  P.L.E.A.S.E Pay Attention to the Positives   Radical Acceptance Self-Soothe with Senses  Distraction (A.C.C.E.P.T.S)    Session Time: 12:00PM - Self Awareness  Participation Level: Active  Behavioral Response: CasualAlertNA  Type of Therapy: Group Therapy  Treatment Goals addressed: Coping  Interventions: CBT, DBT, Solution Focused, Strength-based and Supportive  Suicidal/Homicidal: Nowithout intent/plan  Therapist Response: Sonya Trujillo and processed the definition of self-awareness and how to practice it. Sonya Trujillo shared that she could improve on her self-awareness. Sonya Trujillo Trujillo how self-awareness is the cornerstone for emotional regulation and distress tolerance. Sonya Trujillo reviewed the following steps to increase her self-awareness:  Create a thought diary Practice Mindfulness Have supports that hold you accountable   Sonya Trujillo concluded this section of group by watching a TedTalk video by Sonya Trujillo named "The Power of Self Awareness".     Session Time: 12:45PM - Wrap Up/ End of Group   Participation Level: Active  Behavioral Response: CasualAlertNA  Type of Therapy: Group Therapy  Treatment Goals addressed: Coping  Interventions: CBT, DBT, Solution Focused, Strength-based and Supportive  Suicidal/Homicidal: Nowithout intent/plan  Therapist Response: Reports feeling good at the end of the group session. She shared that she plans on going to an appointment and having new brakes put on her car this afternoon. Sonya Trujillo rated her mood at a 9 and denied having any SI, HI, AVH at this time. Sonya Trujillo reports she will be participating in tomorrow's group session.    [facility]-PHP THERAPIST 03/16/2021

## 2021-03-16 NOTE — Telephone Encounter (Signed)
Patient verified DOB Patient reports mychart view was "self explanatory" and had no concerns. MA reiterated to begin vitamin d weekly and all other labs being normal.

## 2021-03-17 ENCOUNTER — Ambulatory Visit (INDEPENDENT_AMBULATORY_CARE_PROVIDER_SITE_OTHER): Payer: No Payment, Other | Admitting: Licensed Clinical Social Worker

## 2021-03-17 DIAGNOSIS — F603 Borderline personality disorder: Secondary | ICD-10-CM

## 2021-03-17 DIAGNOSIS — F4310 Post-traumatic stress disorder, unspecified: Secondary | ICD-10-CM
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
## 2021-03-17 NOTE — Progress Notes (Signed)
Spoke with patient via Webex video call, used 2 identifiers to correctly identify patient. States that groups are going great, she is enjoying them. Her anxiety has gotten better. She tries not to think about groups ending because they help her a lot throughout her day and she looks forward to them. On scale 1-10 as 10 being worst she rates depression at 2 and anxiety at 3. Denies SI/HI or AV hallucinations. No side effects from medication. Vistaril helps with anxiety. No issues or complaints.

## 2021-03-18 ENCOUNTER — Ambulatory Visit (HOSPITAL_COMMUNITY): Payer: No Payment, Other | Admitting: Licensed Clinical Social Worker

## 2021-03-18 ENCOUNTER — Other Ambulatory Visit: Payer: Self-pay

## 2021-03-18 ENCOUNTER — Other Ambulatory Visit: Payer: Self-pay | Admitting: Physician Assistant

## 2021-03-18 DIAGNOSIS — F603 Borderline personality disorder: Secondary | ICD-10-CM

## 2021-03-18 DIAGNOSIS — F4310 Post-traumatic stress disorder, unspecified: Secondary | ICD-10-CM

## 2021-03-18 NOTE — Psych (Signed)
   [facility] BH PHP THERAPIST PROGRESS NOTE  Sonya Trujillo [id number]   Virtual Visit via Video Note  I connected with Geovanna BARNESon 04/12/22at9:30 AM ESTby a video enabled telemedicine application and verified that I am speaking with the correct person using two identifiers.  Location: Patient:Home Provider:Office  Patient participated in daily group therapy session.     Session Time: 9:00AM - Check In  Participation Level: Active  Behavioral Response: CasualAlertNA  Type of Therapy: Group Therapy  Treatment Goals addressed: Coping  Interventions: CBT, DBT, Solution Focused, Strength-based and Supportive  Summary: Sonya Trujillo is a 45 y.o. female who presents with a previous psychiatric history of Borderline Personality Disorder and PTSD.   Suicidal/Homicidal: Nowithout intent/plan  Therapist Response: Reports feeling good. Reports she relaxed and "chilled" at home after group. Rated her mood at an 8. Denied any SI, HI, AVH at this time.   Diagnosis: Borderline personality disorder (HCC) [F60.3]    1. Borderline personality disorder (HCC)   2. PTSD (post-traumatic stress disorder)       Session Time: 10:00AM - Depression  Participation Level: Active  Behavioral Response: CasualAlertNA  Type of Therapy: Group Therapy  Treatment Goals addressed: Coping  Interventions: CBT, DBT, Solution Focused, Strength-based and Supportive  Suicidal/Homicidal: Nowithout intent/plan  Therapist Response: [name] reports when she hears the word depression, she thinks about the emotion of sadness, lack of motivation and lack of energy.   [name] identified personal depressive symptoms: isolate, irritability, anger, withdraw, lack of energy. [name] reports that Clinical Depression is dangerous due to the risk of completing a suicide attempt or "going through with actual suicidal ideation".   Patients reviewed the following information regarding depression:    Symptoms of Depressive Episode Demographics Risks for Depression Forms of Treatment    Session Time: 11:00AM - The Effects and Types of Depression  Participation Level: Active  Behavioral Response: CasualAlertNA  Type of Therapy: Group Therapy  Treatment Goals addressed: Coping  Interventions: CBT, DBT, Solution Focused, Strength-based and Supportive  Suicidal/Homicidal: Nowithout intent/plan  Therapist Response:  Reviewed different types of clinical depression and discussed the differences between symptoms and classifications.   Major Depressive disorder Bipolar disorder, depressive episodes Persistent depressive disorder Perinatal/Post-partum depression Premenstrual depressive disorder Psychotic depression Seasonal affective disorder   Sonya Trujillo watched a video explaining the neuroscience of depression and the effects it has on the human brain.      Session Time: 12:00PM - CBT Skill: Behavioral Activation  Participation Level: Active  Behavioral Response: CasualAlertNA  Type of Therapy: Group Therapy  Treatment Goals addressed: Coping  Interventions: CBT, DBT, Solution Focused, Strength-based and Supportive  Suicidal/Homicidal: Nowithout intent/plan  Therapist Response: [name] reviewed coping skills to help manage with the symptoms of depression. The coping skills that were reviewed were:   Behavioral Activation Social supports  Three Good Things Mindfulness Meditation  Concluded this section of group by watching TedTalk video, "Conquering Depression: How I Became My Own Hero" by [name].     Session Time: 12:45PM - Wrap Up/ End of Group   Participation Level: Active  Behavioral Response: CasualAlertNA  Type of Therapy: Group Therapy  Treatment Goals addressed: Coping  Interventions: CBT, DBT, Solution Focused, Strength-based and Supportive  Suicidal/Homicidal: Nowithout intent/plan  Therapist Response: Reports she is feeling good at  the end of group. She reports she does not have any plans this afternoon. [name] rated her mood at a 9. [name] denied having SI, HI, AVH.     [facility]-PHP THERAPIST 03/18/2021

## 2021-03-19 ENCOUNTER — Other Ambulatory Visit: Payer: Self-pay

## 2021-03-19 ENCOUNTER — Ambulatory Visit (INDEPENDENT_AMBULATORY_CARE_PROVIDER_SITE_OTHER): Payer: No Payment, Other | Admitting: Licensed Clinical Social Worker

## 2021-03-19 DIAGNOSIS — F4310 Post-traumatic stress disorder, unspecified: Secondary | ICD-10-CM | POA: Diagnosis not present

## 2021-03-19 DIAGNOSIS — F603 Borderline personality disorder: Secondary | ICD-10-CM | POA: Diagnosis not present

## 2021-03-19 NOTE — Psych (Signed)
   [facility] PHP THERAPIST PROGRESS NOTE  Sonya Trujillo [id number]   Virtual Visit via Video Note  I connected with Sonya Trujillo 04/14/22at9:30 AM ESTby a video enabled telemedicine application and verified that I am speaking with the correct person using two identifiers.  Location: Patient:Home Provider:Office  Patient participated in daily group therapy session.     Session Time: 9:00AM - Check In  Participation Level: Active  Behavioral Response: CasualAlertNA  Type of Therapy: Group Therapy  Treatment Goals addressed: Coping  Interventions: CBT, DBT, Solution Focused, Strength-based and Supportive  Summary: Sonya Trujillo is a 45 y.o. female who presents with a previous psychiatric history of Borderline Personality Disorder and PTSD.   Suicidal/Homicidal: Nowithout intent/plan  Therapist Response:  Reports having a good afternoon yesterday after group. She shared that she had [place] for dinner and was able to relax. Rated her mood at a 9. She denies having any SI, HI, AVH currently.   Diagnosis: Borderline personality disorder (HCC) [F60.3]    1. Borderline personality disorder (HCC)   2. PTSD (post-traumatic stress disorder)       Session Time: 10:00AM - Trauma and Grief  Participation Level: Active  Behavioral Response: CasualAlertNA  Type of Therapy: Group Therapy  Treatment Goals addressed: Coping  Interventions: CBT, DBT, Solution Focused, Strength-based and Supportive  Suicidal/Homicidal: Nowithout intent/plan  Therapist Response: Sonya Trujillo shared that trauma is experiencing traumatic events that "shock your system".   Risk factors  Treatments: CBT, Medications, EDMR, Narrative Exposure Therapy, Group Therapy   Types: bullying, complex trauma, community violence, natural disasters, early childhood trauma, intimate partner violence, medical trauma, physical abuse, refugee trauma, sexual abuse, sex trafficking, terrorism and violence,  and traumatic grief.   Sonya Trujillo shared that her father struggled with PTSD. She shared a story of her father hearing a helicopter or airplane and him having an exaggerated response by grabbing his gun and running outside. Shared she feels like she learned those behaviors by witnessing this frequently as a small child.    Watched video on how trauma effects the brain.  Watched video on PTSD and how grief affects the brain.     Session Time: 11:00AM - Chaplain Group   Participation Level: Active  Behavioral Response: CasualAlertNA  Type of Therapy: Group Therapy  Treatment Goals addressed: Coping  Interventions: CBT, DBT, Solution Focused, Strength-based and Supportive  Suicidal/Homicidal: Nowithout intent/plan  Therapist Response:  Please see chaplain's note     Session Time: 12:00PM - Positive Psychology   Participation Level: Active  Behavioral Response: CasualAlertNA  Type of Therapy: Group Therapy  Treatment Goals addressed: Coping  Interventions: CBT, DBT, Solution Focused, Strength-based and Supportive  Suicidal/Homicidal: Nowithout intent/plan  Therapist Response:      Session Time: 12:45PM - Wrap Up/ End of Group   Participation Level: Active  Behavioral Response: CasualAlertNA  Type of Therapy: Group Therapy  Treatment Goals addressed: Coping  Interventions: CBT, DBT, Solution Focused, Strength-based and Supportive  Suicidal/Homicidal: Nowithout intent/plan  Therapist Response:   vAllison reviewed a video explaining what Positive Psychology was and how it is implemented in psychotherapy practices. Sonya Trujillo also reviewed techniques that helps enhance the utilization of positive psychology, which included:   - Gratitude journal - Gratitude visit  - Acts of kindness - Developing meaning Design a beautiful day   [facility]-PHP THERAPIST 03/19/2021

## 2021-03-20 ENCOUNTER — Ambulatory Visit (INDEPENDENT_AMBULATORY_CARE_PROVIDER_SITE_OTHER): Payer: No Payment, Other | Admitting: Licensed Clinical Social Worker

## 2021-03-20 DIAGNOSIS — F4310 Post-traumatic stress disorder, unspecified: Secondary | ICD-10-CM | POA: Diagnosis not present

## 2021-03-20 DIAGNOSIS — F603 Borderline personality disorder: Secondary | ICD-10-CM

## 2021-03-20 NOTE — Progress Notes (Signed)
Virtual Visit via Video Note  I connected with Sonya Trujillo on 03/20/21 at  9:00 AM EDT by a video enabled telemedicine application and verified that I am speaking with the correct person using two identifiers.  Location: Patient: Home Provider: Office   I discussed the limitations of evaluation and management by telemedicine and the availability of in person appointments. The patient expressed understanding and agreed to proceed.   I discussed the assessment and treatment plan with the patient. The patient was provided an opportunity to ask questions and all were answered. The patient agreed with the plan and demonstrated an understanding of the instructions.   The patient was advised to call back or seek an in-person evaluation if the symptoms worsen or if the condition fails to improve as anticipated.  I provided 15 minutes of non-face-to-face time during this encounter.   Oneta Rack, NP   [facility]  Alis Sawchuk [id number]  Admission date: 03/02/2021 Discharge date: 03/20/2021  Reason for admission: Per admission assessment note:History of Present illness: Sonya Trujillo is a 45 y.o. female present to [facility] urgent care due to worsening anxiety and depression.  She reports she missed a child support hearing between her and her ex-husband a few weeks ago.  States she has not followed up because she has been too anxious.  Reported passive suicidal ideations however denied plan or intent.  Reports daily use of marijuana to help with her anxiety.  Reports she was followed by therapy and psychiatry in the past however has not been able to follow-up.  States she was prescribed Prozac and a medication that starts with a T.  Denied previous inpatient admissions.  Family of Origin Issues: Dodie reports her significant other continues to be supportive.  Panic continues to focus on her education.   Has not followed up with child support case as of yet.  Progress in Program Toward Treatment Goals: Ongoing, patient attended and participated with daily group session with active and engaged participation.  Currently denying suicidal or homicidal ideations.  Denies auditory or visual hallucinations.  During this admission patient was initiated on hydroxyzine for generalized anxiety disorder.  She reports taking and tolerating medications well.  Will make medication refills available at discharge.  Support, encouragement and reassurance was provided.  Progress (rationale): CSW to provide additional outpatient resources for [facility] for additional group session  Take all medications as prescribed. Keep all follow-up appointments as scheduled.  Do not consume alcohol or use illegal drugs while on prescription medications. Report any adverse effects from your medications to your primary care provider promptly.  In the event of recurrent symptoms or worsening symptoms, call 911, a crisis hotline, or go to the nearest emergency department for evaluation.   Hillery Jacks NP  03/20/2021

## 2021-03-20 NOTE — Psych (Signed)
   [facility] BH PHP THERAPIST PROGRESS NOTE  Kerina Simoneau [id number]   Virtual Visit via Video Note  I connected with [name] 04/15/22at9:30 AM ESTby a video enabled telemedicine application and verified that I am speaking with the correct person using two identifiers.  Location: Patient:Home Provider:Office  Patient participated in daily group therapy session.     Session Time: 9:00AM - Check In  Participation Level: Active  Behavioral Response: CasualAlertNA  Type of Therapy: Group Therapy  Treatment Goals addressed: Coping  Interventions: CBT, DBT, Solution Focused, Strength-based and Supportive  Summary: Sonya Trujillo is a 45 y.o. female who presents with a previous psychiatric history of Borderline Personality Disorder and PTSD.   Suicidal/Homicidal: Nowithout intent/plan  Therapist Response:  Reports feeling good today. She reports she is a little sad about completing group, however she feels prepared in participating in individual therapy moving forward. Sonya Trujillo shared that she does not have any concerns and denied having any SI,HI, AVH.     Diagnosis: PTSD (post-traumatic stress disorder) [F43.10]    1. PTSD (post-traumatic stress disorder)   2. Borderline personality disorder (HCC)       Session Time: 10:00AM - Cognitive Distortions   Participation Level: Active  Behavioral Response: CasualAlertNA  Type of Therapy: Group Therapy  Treatment Goals addressed: Coping  Interventions: CBT, DBT, Solution Focused, Strength-based and Supportive  Suicidal/Homicidal: Nowithout intent/plan  Therapist Response: [name] watched a video describing and explaining what cognitive distortions are and how they impact individuals   Schae Identified the following cognitive distortions:   Disqualifying the positive Mind [employer] Telling  All or Nothing Thinking  Personalization  Completed worksheet Challenging thoughts and behaviors by  identifying cost and benefits and developing an alternatives    Session Time: 11:00AM - Cognitive Restructuring   Participation Level: Active  Behavioral Response: CasualAlertNA  Type of Therapy: Group Therapy  Treatment Goals addressed: Coping  Interventions: CBT, DBT, Solution Focused, Strength-based and Supportive  Suicidal/Homicidal: Nowithout intent/plan  Therapist Response:  [name] reviewed and process cognitive restructuring process and the various techniques. Techniques reviewed were:  Decatastrophizing Putting Thoughts On Trial  Socratic Questioning  Pallavi also reviewed the cognitive triangle and completed worksheets that challenged her negative thoughts and assisted her in developing alternative thought patterns.      Session Time: 12:00PM - Radical Acceptance   Participation Level: Active  Behavioral Response: CasualAlertNA  Type of Therapy: Group Therapy  Treatment Goals addressed: Coping  Interventions: CBT, DBT, Solution Focused, Strength-based and Supportive  Suicidal/Homicidal: Nowithout intent/plan  Therapist Response: [name] reviewed radical acceptance and practiced the exercise by completing a worksheet with this Clinical research associate. Timara concluded the group session by watching TedTalk video The Secret of Becoming Mentally Strong.      Session Time: 12:45PM - Wrap Up/ End of Group   Participation Level: Active  Behavioral Response: CasualAlertNA  Type of Therapy: Group Therapy  Treatment Goals addressed: Coping  Interventions: CBT, DBT, Solution Focused, Strength-based and Supportive  Suicidal/Homicidal: Nowithout intent/plan  Therapist Response: Aaliya reports she felt good at the end of group. She reports that she feels good moving forward and completing the program. [name] rated her mood at a 9. She denied having any SI, HI, AVH.    [facility]-PHP THERAPIST 03/20/2021

## 2021-03-23 ENCOUNTER — Other Ambulatory Visit: Payer: Self-pay

## 2021-03-29 MED FILL — Ergocalciferol Cap 1.25 MG (50000 Unit): ORAL | 28 days supply | Qty: 4 | Fill #0 | Status: CN

## 2021-03-30 ENCOUNTER — Other Ambulatory Visit: Payer: Self-pay

## 2021-04-07 ENCOUNTER — Other Ambulatory Visit: Payer: Self-pay

## 2021-04-15 ENCOUNTER — Ambulatory Visit: Payer: Medicaid Other

## 2021-04-29 ENCOUNTER — Ambulatory Visit: Payer: Medicaid Other

## 2021-05-26 ENCOUNTER — Ambulatory Visit: Payer: Self-pay | Attending: Family Medicine | Admitting: Family Medicine

## 2021-05-26 ENCOUNTER — Other Ambulatory Visit: Payer: Self-pay

## 2021-05-26 ENCOUNTER — Encounter: Payer: Self-pay | Admitting: Family Medicine

## 2021-05-26 VITALS — BP 125/82 | HR 50 | Ht 66.0 in | Wt 261.0 lb

## 2021-05-26 DIAGNOSIS — K580 Irritable bowel syndrome with diarrhea: Secondary | ICD-10-CM

## 2021-05-26 DIAGNOSIS — R030 Elevated blood-pressure reading, without diagnosis of hypertension: Secondary | ICD-10-CM

## 2021-05-26 DIAGNOSIS — F603 Borderline personality disorder: Secondary | ICD-10-CM

## 2021-05-26 MED ORDER — DICYCLOMINE HCL 10 MG PO CAPS
10.0000 mg | ORAL_CAPSULE | Freq: Two times a day (BID) | ORAL | 2 refills | Status: DC | PRN
  Filled 2021-05-26 – 2022-02-23 (×4): qty 60, 30d supply, fill #0

## 2021-05-26 MED FILL — Ergocalciferol Cap 1.25 MG (50000 Unit): ORAL | 28 days supply | Qty: 4 | Fill #0 | Status: AC

## 2021-05-26 NOTE — Progress Notes (Signed)
Subjective:  Patient ID: Sonya Trujillo, female    DOB: [birth year]  Age: 45 y.o. MRN: [id number]  CC: New Patient (Initial Visit)   HPI Sonya Trujillo is a 45 year old female with a history of PTSD, borderline personality disorder who presents today to establish care. She is under the partial Hospitalization program and is awaiting an appointment with the LCSW at [facility].  Interval History: Feels she might have IBS. She wakes up sweating with abdominal pain once a month which is not precipitated by any event. Usually has Diarrhea about 2-3 times in the morning which is sometimes preceding by abdominal discomfort unrelated to specific foods.  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  Gets easily distracted but was never diagnosed as a kid. Her blood pressure was initially elevated but repeat today returned normal.  She has had instances where her BP has been elevated outside of the clinic.  She currently does not have a monitor to check her blood pressure. Past Medical History:  Diagnosis Date   Borderline personality disorder (HCC)    Migraine    Panic attack    PTSD (post-traumatic stress disorder)     Past Surgical History:  Procedure Laterality Date   TUBAL LIGATION      Family History  Problem Relation Age of Onset   Cancer Father    Diabetes Father    Alcohol abuse Father    Stroke Other    Cancer Other    CAD Other    Bipolar disorder Mother    Post-traumatic stress disorder Mother     No Known Allergies  Outpatient Medications Prior to Visit  Medication Sig Dispense Refill   acetaminophen (TYLENOL) 325 MG tablet Take 650 mg by mouth every 6 (six) hours as needed for moderate pain. (Patient not taking: Reported on 05/26/2021)     hydrOXYzine (ATARAX/VISTARIL) 25 MG tablet Take 1 tablet (25 mg total) by mouth 3 (three) times daily as needed.  (Patient not taking: Reported on 05/26/2021) 90 tablet 0   Vitamin D, Ergocalciferol, (DRISDOL) 1.25 MG (50000 UNIT) CAPS capsule Take 1 capsule (50,000 Units total) by mouth every 7 (seven) days. (Patient not taking: Reported on 05/26/2021) 4 capsule 2   Vitamin D, Ergocalciferol, (DRISDOL) 1.25 MG (50000 UNIT) CAPS capsule TAKE 1 CAPSULE (50,000 UNITS TOTAL) BY MOUTH EVERY 7 (SEVEN) DAYS. (Patient not taking: Reported on 05/26/2021) 4 capsule 2   No facility-administered medications prior to visit.     ROS Review of Systems  Constitutional:  Negative for activity change, appetite change and fatigue.  HENT:  Negative for congestion, sinus pressure and sore throat.   Eyes:  Negative for visual disturbance.  Respiratory:  Negative for cough, chest tightness, shortness of breath and wheezing.   Cardiovascular:  Negative for chest pain and palpitations.  Gastrointestinal:  Positive for diarrhea. Negative for abdominal distention, abdominal pain and constipation.  Endocrine: Negative for polydipsia.  Genitourinary:  Negative for dysuria and frequency.  Musculoskeletal:  Negative for arthralgias and back pain.  Skin:  Negative for rash.  Neurological:  Negative for tremors, light-headedness and numbness.  Hematological:  Does not bruise/bleed easily.  Psychiatric/Behavioral:  Negative for agitation and behavioral problems.    Objective:  BP 125/82   Pulse (!) 50   Ht 5\' 6"  (1.676 m)   Wt 261 lb (118.4 kg)   SpO2 99%  BMI 42.13 kg/m   BP/Weight 05/26/2021 03/05/2021 03/02/2021  Systolic BP 125 128 135  Diastolic BP 82 84 96  Wt. (Lbs) 261 260 -  BMI 42.13 41.97 -      Physical Exam Constitutional:      Appearance: She is well-developed.  Neck:     Vascular: No JVD.  Cardiovascular:     Rate and Rhythm: Bradycardia present.     Heart sounds: Normal heart sounds. No murmur heard. Pulmonary:     Effort: Pulmonary effort is normal.     Breath sounds: Normal breath sounds. No  wheezing or rales.  Chest:     Chest wall: No tenderness.  Abdominal:     General: Bowel sounds are normal. There is no distension.     Palpations: Abdomen is soft. There is no mass.     Tenderness: There is no abdominal tenderness.  Musculoskeletal:        General: Normal range of motion.     Right lower leg: No edema.     Left lower leg: No edema.  Neurological:     Mental Status: She is alert and oriented to person, place, and time.  Psychiatric:        Mood and Affect: Mood normal.    CMP Latest Ref Rng & Units 03/05/2021 09/03/2019 07/08/2017  Glucose 65 - 99 mg/dL 960(A) 540(J) 811(B)  BUN 6 - 24 mg/dL 9 15 9   Creatinine 0.57 - 1.00 mg/dL 1.47 8.29  Sodium 134 - 144 mmol/L 138 138 133(L)  Potassium 3.5 - 5.2 mmol/L 4.3 3.6 3.5  Chloride 96 - 106 mmol/L 106 106 103  CO2 22 - 32 mmol/L - 25 23  Calcium 8.7 - 10.2 mg/dL 9.2 5.62) 8.3(L)  Total Protein 6.0 - 8.5 g/dL 6.1 - -  Total Bilirubin 0.0 - 1.2 mg/dL 0.5 - -  Alkaline Phos 44 - 121 IU/L 46 - -  AST 0 - 40 IU/L 15 - -  ALT U/L - - -    Lipid Panel     Component Value Date/Time   CHOL 177 03/05/2021 1101   TRIG 72 03/05/2021 1101   HDL 40 03/05/2021 1101   CHOLHDL 4.4 03/05/2021 1101   CHOLHDL 3.6 Ratio 01/03/2007 1834   VLDL 15 01/03/2007 1834   LDLCALC 123 (H) 03/05/2021 1101    CBC    Component Value Date/Time   WBC 6.5 03/05/2021 1101   WBC 11.0 (H) 09/03/2019 2146   RBC 4.90 03/05/2021 1101   RBC 4.47 09/03/2019 2146   HGB 15.1 03/05/2021 1101   HCT 44.7 03/05/2021 1101   PLT 242 03/05/2021 1101   MCV 91 03/05/2021 1101   MCV 94 12/04/2014 1008   MCH 30.8 03/05/2021 1101   MCH 30.9 09/03/2019 2146   MCHC 33.8 03/05/2021 1101   MCHC 33.1 09/03/2019 2146   RDW 12.4 03/05/2021 1101   RDW 13.4 12/04/2014 1008   LYMPHSABS 1.9 03/05/2021 1101   LYMPHSABS 2.0 12/04/2014 1008   MONOABS 2.0 (H) 07/08/2017 0804   MONOABS 0.7 12/04/2014 1008   EOSABS 0.2 03/05/2021 1101   EOSABS 0.2 12/04/2014  1008   BASOSABS 0.1 03/05/2021 1101   BASOSABS 0.1 12/04/2014 1008    No results found for: HGBA1C  Assessment & Plan:  1. Irritable bowel syndrome with diarrhea Will initiate Bentyl Advised to cut back on dosing if constipation occurs - dicyclomine (BENTYL) 10 MG capsule; Take 1 capsule (10 mg total) by mouth 2 (two) times  daily as needed for spasms.  Dispense: 60 capsule; Refill: 2  2. Borderline personality disorder (HCC) Followed by the partial hospitalization program Advised that with regards to evaluation for ADD she would need to discuss this with her LCSW to expedite Psych evaluation  3. Elevated blood pressure reading in office without diagnosis of hypertension Repeat blood pressure was normal Advised to obtain blood pressure cuff and keep a blood pressure log at home Counseled on blood pressure goal of less than 130/80, low-sodium, DASH diet, medication compliance, 150 minutes of moderate intensity exercise per week. Discussed medication compliance, adverse effects.   Meds ordered this encounter  Medications   dicyclomine (BENTYL) 10 MG capsule    Sig: Take 1 capsule (10 mg total) by mouth 2 (two) times daily as needed for spasms.    Dispense:  60 capsule    Refill:  2    Follow-up: Return in about 3 months (around 08/26/2021) for Medical conditions.       Hoy Register, MD, FAAFP. [facility] [place], [place] [phone]   05/26/2021, 6:04 PM

## 2021-08-26 ENCOUNTER — Ambulatory Visit: Payer: Medicaid Other | Admitting: Family Medicine

## 2021-10-13 ENCOUNTER — Ambulatory Visit: Payer: Medicaid Other | Admitting: Family Medicine

## 2021-12-11 ENCOUNTER — Ambulatory Visit (HOSPITAL_COMMUNITY): Payer: No Payment, Other | Admitting: Licensed Clinical Social Worker

## 2021-12-17 ENCOUNTER — Ambulatory Visit (HOSPITAL_COMMUNITY): Payer: No Payment, Other | Admitting: Psychiatry

## 2021-12-24 ENCOUNTER — Other Ambulatory Visit: Payer: Self-pay

## 2021-12-24 ENCOUNTER — Encounter (HOSPITAL_COMMUNITY): Payer: Self-pay | Admitting: Psychiatry

## 2021-12-24 ENCOUNTER — Ambulatory Visit (INDEPENDENT_AMBULATORY_CARE_PROVIDER_SITE_OTHER): Payer: No Payment, Other | Admitting: Psychiatry

## 2021-12-24 VITALS — BP 143/102 | HR 65 | Ht 66.0 in | Wt 253.0 lb

## 2021-12-24 DIAGNOSIS — F603 Borderline personality disorder: Secondary | ICD-10-CM | POA: Diagnosis not present

## 2021-12-24 DIAGNOSIS — F4310 Post-traumatic stress disorder, unspecified: Secondary | ICD-10-CM

## 2021-12-24 DIAGNOSIS — F331 Major depressive disorder, recurrent, moderate: Secondary | ICD-10-CM | POA: Diagnosis not present

## 2021-12-24 DIAGNOSIS — F411 Generalized anxiety disorder: Secondary | ICD-10-CM | POA: Diagnosis not present

## 2021-12-24 MED ORDER — SERTRALINE HCL 50 MG PO TABS
50.0000 mg | ORAL_TABLET | Freq: Every day | ORAL | 2 refills | Status: DC
  Filled 2021-12-24 – 2022-01-19 (×3): qty 30, 30d supply, fill #0
  Filled 2022-02-20: qty 30, 30d supply, fill #1
  Filled 2022-03-24 – 2022-04-07 (×2): qty 30, 30d supply, fill #2

## 2021-12-24 MED ORDER — HYDROXYZINE HCL 25 MG PO TABS
25.0000 mg | ORAL_TABLET | Freq: Two times a day (BID) | ORAL | 0 refills | Status: DC | PRN
  Filled 2021-12-24 – 2022-01-19 (×2): qty 90, 45d supply, fill #0

## 2021-12-24 MED ORDER — HYDROXYZINE PAMOATE 50 MG PO CAPS
50.0000 mg | ORAL_CAPSULE | Freq: Every evening | ORAL | 2 refills | Status: DC | PRN
  Filled 2021-12-24 – 2022-02-23 (×2): qty 30, 30d supply, fill #0
  Filled 2022-03-24 – 2022-04-07 (×2): qty 30, 30d supply, fill #1
  Filled 2022-05-18 – 2022-06-19 (×2): qty 30, 30d supply, fill #2
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
## 2021-12-24 NOTE — Patient Instructions (Signed)
Follow up in 4 weeks.  Recommend starting therapy.  

## 2021-12-31 ENCOUNTER — Other Ambulatory Visit: Payer: Self-pay

## 2022-01-19 ENCOUNTER — Other Ambulatory Visit: Payer: Self-pay

## 2022-01-19 MED FILL — Ergocalciferol Cap 1.25 MG (50000 Unit): ORAL | 28 days supply | Qty: 4 | Fill #0 | Status: AC

## 2022-01-28 ENCOUNTER — Encounter (HOSPITAL_COMMUNITY): Payer: No Payment, Other | Admitting: Psychiatry

## 2022-02-04 ENCOUNTER — Ambulatory Visit (INDEPENDENT_AMBULATORY_CARE_PROVIDER_SITE_OTHER): Payer: No Payment, Other | Admitting: Licensed Clinical Social Worker

## 2022-02-04 DIAGNOSIS — F411 Generalized anxiety disorder: Secondary | ICD-10-CM | POA: Diagnosis not present
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
## 2022-02-10 ENCOUNTER — Other Ambulatory Visit: Payer: Self-pay

## 2022-02-10 ENCOUNTER — Other Ambulatory Visit: Payer: Self-pay | Admitting: Physician Assistant

## 2022-02-11 ENCOUNTER — Other Ambulatory Visit: Payer: Self-pay

## 2022-02-17 ENCOUNTER — Other Ambulatory Visit: Payer: Self-pay

## 2022-02-22 ENCOUNTER — Other Ambulatory Visit: Payer: Self-pay

## 2022-02-23 ENCOUNTER — Other Ambulatory Visit: Payer: Self-pay

## 2022-03-04 ENCOUNTER — Encounter (HOSPITAL_COMMUNITY): Payer: No Payment, Other | Admitting: Psychiatry

## 2022-03-25 ENCOUNTER — Other Ambulatory Visit: Payer: Self-pay

## 2022-03-31 ENCOUNTER — Telehealth (HOSPITAL_COMMUNITY): Payer: Self-pay | Admitting: Licensed Clinical Social Worker

## 2022-04-01 ENCOUNTER — Other Ambulatory Visit: Payer: Self-pay

## 2022-04-07 ENCOUNTER — Other Ambulatory Visit: Payer: Self-pay

## 2022-04-08 ENCOUNTER — Ambulatory Visit (HOSPITAL_COMMUNITY): Payer: No Payment, Other | Admitting: Licensed Clinical Social Worker

## 2022-04-29 ENCOUNTER — Ambulatory Visit (HOSPITAL_COMMUNITY): Payer: No Payment, Other | Admitting: Licensed Clinical Social Worker

## 2022-05-18 ENCOUNTER — Other Ambulatory Visit (HOSPITAL_COMMUNITY): Payer: Self-pay

## 2022-05-19 ENCOUNTER — Other Ambulatory Visit: Payer: Self-pay

## 2022-05-26 ENCOUNTER — Other Ambulatory Visit: Payer: Self-pay

## 2022-06-19 ENCOUNTER — Other Ambulatory Visit (HOSPITAL_COMMUNITY): Payer: Self-pay

## 2022-12-22 ENCOUNTER — Other Ambulatory Visit: Payer: Self-pay

## 2023-07-19 ENCOUNTER — Other Ambulatory Visit: Payer: Self-pay | Admitting: Registered Nurse

## 2023-07-20 LAB — CMP12+LP+TP+TSH+6AC+CBC/D/PLT: Immature Grans (Abs): 0 10*3/uL (ref 0.0–0.1)

## 2023-07-21 ENCOUNTER — Ambulatory Visit: Payer: Self-pay

## 2023-07-21 VITALS — BP 143/88 | Ht 66.0 in | Wt 216.0 lb

## 2023-07-21 DIAGNOSIS — Z0000 Encounter for general adult medical examination without abnormal findings: Secondary | ICD-10-CM

## 2023-07-26 ENCOUNTER — Ambulatory Visit: Payer: Self-pay | Admitting: Registered Nurse

## 2023-07-26 ENCOUNTER — Encounter: Payer: Self-pay | Admitting: Registered Nurse

## 2023-07-26 VITALS — BP 150/94 | HR 54 | Temp 98.0°F | Resp 16 | Ht 66.0 in | Wt 218.0 lb

## 2023-07-26 DIAGNOSIS — R7401 Elevation of levels of liver transaminase levels: Secondary | ICD-10-CM | POA: Insufficient documentation

## 2023-07-26 DIAGNOSIS — R030 Elevated blood-pressure reading, without diagnosis of hypertension: Secondary | ICD-10-CM

## 2023-07-26 DIAGNOSIS — R519 Headache, unspecified: Secondary | ICD-10-CM
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
## 2023-07-26 NOTE — Progress Notes (Signed)
Pt here today to be seen for a hx of migraine headaches. Pt states that she had a migraine that last x4 days. Pt reports dull ache being 3/10 behind bilateral eyes with it being greater on behind the  left eye today.

## 2023-07-26 NOTE — Patient Instructions (Addendum)
Form - Headache Record There are many types and causes of headaches. A headache record can help guide your treatment plan. Use this form to record the details. Bring this form with you to your follow-up visits. Follow your health care provider's instructions on how to describe your headache. You may be asked to: Use a pain scale. This is a tool to rate the intensity of your headache using words or numbers. Describe what your headache feels like, such as dull, achy, throbbing, or sharp. Headache record Date: _______________ Time (from start to end): ____________________ Location of the headache: _________________________ Intensity of the headache: ____________________ Description of the headache: ______________________________________________________________ Hours of sleep the night before the headache: __________ Food or drinks before the headache started: ______________________________________________________________________________________ Events before the headache started: _______________________________________________________________________________________________ Symptoms before the headache started: __________________________________________________________________________________________ Symptoms during the headache: __________________________________________________________________________________________________ Treatment: ________________________________________________________________________________________________________________ Effect of treatment: _________________________________________________________________________________________________________ Other comments: ___________________________________________________________________________________________________________ Date: _______________ Time (from start to end): ____________________ Location of the headache: _________________________ Intensity of the headache: ____________________ Description of the headache:  ______________________________________________________________ Hours of sleep the night before the headache: __________ Food or drinks before the headache started: ______________________________________________________________________________________ Events before the headache started: ____________________________________________________________________________________________ Symptoms before the headache started: _________________________________________________________________________________________ Symptoms during the headache: _______________________________________________________________________________________________ Treatment: ________________________________________________________________________________________________________________ Effect of treatment: _________________________________________________________________________________________________________ Other comments: ___________________________________________________________________________________________________________ Date: _______________ Time (from start to end): ____________________ Location of the headache: _________________________ Intensity of the headache: ____________________ Description of the headache: ______________________________________________________________ Hours of sleep the night before the headache: __________ Food or drinks before the headache started: ______________________________________________________________________________________ Events before the headache started: ____________________________________________________________________________________________ Symptoms before the headache started: _________________________________________________________________________________________ Symptoms during the headache: _______________________________________________________________________________________________ Treatment:  ________________________________________________________________________________________________________________ Effect of treatment: _________________________________________________________________________________________________________ Other comments: ___________________________________________________________________________________________________________ Date: _______________ Time (from start to end): ____________________ Location of the headache: _________________________ Intensity of the headache: ____________________ Description of the headache: ______________________________________________________________ Hours of sleep the night before the headache: _________ Food or drinks before the headache started: ______________________________________________________________________________________ Events before the headache started: ____________________________________________________________________________________________ Symptoms before the headache started: _________________________________________________________________________________________ Symptoms during the headache: _______________________________________________________________________________________________ Treatment: ________________________________________________________________________________________________________________ Effect of treatment: _________________________________________________________________________________________________________ Other comments: ___________________________________________________________________________________________________________ Date: _______________ Time (from start to end): ____________________ Location of the headache: _________________________ Intensity of the headache: ____________________ Description of the headache: ______________________________________________________________ Hours of sleep the night before the headache: _________ Food or drinks before the headache started:  ______________________________________________________________________________________ Events before the headache started: ____________________________________________________________________________________________ Symptoms before the headache started: _________________________________________________________________________________________ Symptoms during the headache: _______________________________________________________________________________________________ Treatment: ________________________________________________________________________________________________________________ Effect of treatment: _________________________________________________________________________________________________________ Other comments: ___________________________________________________________________________________________________________ This information is not intended to replace advice given to you by your health care provider. Make sure you discuss any questions you have with your health care provider. Document Revised: 04/22/2021 Document Reviewed: 04/22/2021 Elsevier Patient Education  2024 [employer] Migraine Headache A migraine headache is an intense pulsing or throbbing pain on one or both sides of the head. Migraine headaches may also cause other symptoms, such as nausea, vomiting, and sensitivity to light and noise. A migraine headache can last from 4 hours to 3 days. Talk with your health care provider about what things may bring on (trigger) your migraine headaches. What are the causes? The exact cause is not known. However, a migraine may be caused when nerves in the brain get irritated and release chemicals that cause blood vessels to become inflamed. This inflammation causes pain. Migraines may be triggered or caused by: Smoking. Medicines, such as: Nitroglycerin, which is used to treat chest pain. Birth control pills. Estrogen. Certain blood pressure medicines. Foods or drinks that  contain nitrates, glutamate, aspartame,  MSG, or tyramine. Certain foods or drinks, such as aged cheeses, chocolate, alcohol, or caffeine. Doing physical activity that is very hard. Other triggers may include: Menstruation. Pregnancy. Hunger. Stress. Getting too much or too little sleep. Weather changes. Tiredness (fatigue). What increases the risk? The following factors may make you more likely to have migraine headaches: Being between the ages of 70-65 years old. Being female. Having a family history of migraine headaches. Being Caucasian. Having a mental health condition, such as depression or anxiety. Being obese. What are the signs or symptoms? The main symptom of this condition is pulsing or throbbing pain. This pain may: Happen in any area of the head, such as on one or both sides. Make it hard to do daily activities. Get worse with physical activity. Get worse around bright lights, loud noises, or smells. Other symptoms may include: Nausea. Vomiting. Dizziness. Before a migraine headache starts, you may get warning signs (an aura). An aura may include: Seeing flashing lights or having blind spots. Seeing bright spots, halos, or zigzag lines. Having tunnel vision or blurred vision. Having numbness or a tingling feeling. Having trouble talking. Having muscle weakness. After a migraine ends, you may have symptoms. These may include: Feeling tired. Trouble concentrating. How is this diagnosed? A migraine headache can be diagnosed based on: Your symptoms. A physical exam. Tests, such as: A CT scan or an MRI of the head. These tests can help rule out other causes of headaches. Taking fluid from the spine (lumbar puncture) to examine it (cerebrospinal fluid analysis, or CSF analysis). How is this treated? This condition may be treated with medicines that: Relieve pain and nausea. Prevent migraines. Treatment may also include: Acupuncture. Lifestyle changes like  avoiding foods that trigger migraine headaches. Learning ways to control your body (biofeedback). Talk therapy to help you know and deal with negative thoughts (cognitive behavioral therapy). Follow these instructions at home: Medicines Take over-the-counter and prescription medicines only as told by your provider. Ask your provider if the medicine prescribed to you: Requires you to avoid driving or using machinery. Can cause constipation. You may need to take these actions to prevent or treat constipation: Drink enough fluid to keep your pee (urine) pale yellow. Take over-the-counter or prescription medicines. Eat foods that are high in fiber, such as beans, whole grains, and fresh fruits and vegetables. Limit foods that are high in fat and processed sugars, such as fried or sweet foods. Lifestyle  Do not drink alcohol. Do not use any products that contain nicotine or tobacco. These products include cigarettes, chewing tobacco, and vaping devices, such as e-cigarettes. If you need help quitting, ask your provider. Get 7-9 hours of sleep each night, or the amount recommended by your provider. Find ways to manage stress, such as meditation, deep breathing, or yoga. Try to exercise regularly. This can help lessen how bad and how often your migraines occur. General instructions Keep a journal to find out what triggers your migraines, so you can avoid those things. For example, write down: What you eat and drink. How much sleep you get. Any change to your diet or medicines. If you have a migraine headache: Avoid things that make your symptoms worse, such as bright lights. Lie down in a dark, quiet room. Do not drive or use machinery. Ask your provider what activities are safe for you while you have symptoms. Keep all follow-up visits. Your provider will monitor your symptoms and recommend any further treatment. Where to find more information Coalition  for Headache and Migraine Patients  (CHAMP): headachemigraine.org American Migraine Foundation: americanmigrainefoundation.org National Headache Foundation: headaches.org Contact a health care provider if: You have symptoms that are different or worse than your usual migraine headache symptoms. You have more than 15 days of headaches in one month. Get help right away if: Your migraine headache becomes severe or lasts more than 72 hours. You have a fever or stiff neck. You have vision loss. Your muscles feel weak or like you cannot control them. You lose your balance often or have trouble walking. You faint. You have a seizure. This information is not intended to replace advice given to you by your health care provider. Make sure you discuss any questions you have with your health care provider. Document Revised: 07/19/2022 Document Reviewed: 07/19/2022 Elsevier Patient Education  2024 [employer]. Preventing Hypertension Hypertension, also called high blood pressure, is when the force of blood pumping through the arteries is too strong. Arteries are blood vessels that carry blood from the heart throughout the body. Often, hypertension does not cause symptoms until blood pressure is very high. It is important to have your blood pressure checked regularly. Diet and lifestyle changes can help you prevent hypertension, and they may make you feel better overall and improve your quality of life. If you already have hypertension, you may control it with diet and lifestyle changes, as well as with medicine. How can this condition affect me? Over time, hypertension can damage the arteries and decrease blood flow to important parts of the body, including the brain, heart, and kidneys. By keeping your blood pressure in a healthy range, you can help prevent complications like heart attack, heart failure, stroke, kidney failure, and vascular dementia. What can increase my risk? An unhealthy diet and a lack of physical activity can make you  more likely to develop high blood pressure. Some other risk factors include: Age. The risk increases with age. Having family members who have had high blood pressure. Having certain health conditions, such as thyroid problems. Being overweight or obese. Drinking too much alcohol or caffeine. Having too much fat, sugar, calories, or salt (sodium) in your diet. Smoking or using illegal drugs. Taking certain medicines, such as antidepressants, decongestants, birth control pills, and NSAIDs, such as ibuprofen. What actions can I take to prevent or manage this condition? Work with your health care provider to make a hypertension prevention plan that works for you. You may be referred for counseling on a healthy diet and physical activity. Follow your plan and keep all follow-up visits. Diet changes Maintain a healthy diet. This includes: Eating less salt (sodium). Ask your health care provider how much sodium is safe for you to have. The general recommendation is to have less than 1 tsp (2,300 mg) of sodium a day. Do not add salt to your food. Choose low-sodium options when grocery shopping and eating out. Limiting fats in your diet. You can do this by eating low-fat or fat-free dairy products and by eating less red meat. Eating more fruits, vegetables, and whole grains. Make a goal to eat: 1-2 cups of fresh fruits and vegetables each day. 3-4 servings of whole grains each day. Avoiding foods and beverages that have added sugars. Eating fish that contain healthy fats (omega-3 fatty acids), such as mackerel or salmon. If you need help putting together a healthy eating plan, try the DASH diet. This diet is high in fruits, vegetables, and whole grains. It is low in sodium, red meat, and added sugars.  DASH stands for Dietary Approaches to Stop Hypertension. Lifestyle changes  Lose weight if you are overweight. Losing just 3-5% of your body weight can help prevent or control hypertension. For  example, if your present weight is 200 lb (91 kg), a loss of 3-5% of your weight means losing 6-10 lb (2.7-4.5 kg). Ask your health care provider to help you with a diet and exercise plan to safely lose weight. Get enough exercise. Do at least 150 minutes of moderate-intensity exercise each week. You could do this in short exercise sessions several times a day, or you could do longer exercise sessions a few times a week. For example, you could take a brisk 10-minute walk or bike ride, 3 times a day, for 5 days a week. Find ways to reduce stress, such as exercising, meditating, listening to music, or taking a yoga class. If you need help reducing stress, ask your health care provider. Do not use any products that contain nicotine or tobacco. These products include cigarettes, chewing tobacco, and vaping devices, such as e-cigarettes. Chemicals in tobacco and nicotine products raise your blood pressure each time you use them. If you need help quitting, ask your health care provider. Learn how to check your blood pressure at home. Make sure that you know your personal target blood pressure, as told by your health care provider. Try to sleep 7-9 hours per night. Alcohol use Do not drink alcohol if: Your health care provider tells you not to drink. You are pregnant, may be pregnant, or are planning to become pregnant. If you drink alcohol: Limit how much you have to: 0-1 drink a day for women. 0-2 drinks a day for men. Know how much alcohol is in your drink. In the [place] one drink equals one 12 oz bottle of beer (355 mL), one 5 oz glass of wine (148 mL), or one 1 oz glass of hard liquor (44 mL). Medicines In addition to diet and lifestyle changes, your health care provider may recommend medicines to help lower your blood pressure. In general: You may need to try a few different medicines to find what works best for you. You may need to take more than one medicine. Take over-the-counter and  prescription medicines only as told by your health care provider. Questions to ask your health care provider What is my blood pressure goal? How can I lower my risk for high blood pressure? How should I monitor my blood pressure at home? Where to find support Your health care provider can help you prevent hypertension and help you keep your blood pressure at a healthy level. Your local hospital or your community may also provide support services and prevention programs. The American Heart Association offers an online support network at supportnetwork.heart.org Where to find more information Learn more about hypertension from: National Heart, Lung, and Blood Institute: PopSteam.is Centers for Disease Control and Prevention: FootballExhibition.com.br American Academy of Family Physicians: familydoctor.org Learn more about the DASH diet from: National Heart, Lung, and Blood Institute: PopSteam.is Contact a health care provider if: You think you are having a reaction to medicines you have taken. You have recurrent headaches or feel dizzy. You have swelling in your ankles. You have trouble with your vision. Get help right away if: You have sudden, severe chest, back, or abdominal pain or discomfort. You have shortness of breath. You have a sudden, severe headache. These symptoms may be an emergency. Get help right away. Call 911. Do not wait to see if the symptoms  will go away. Do not drive yourself to the hospital. Summary Hypertension often does not cause any symptoms until blood pressure is very high. It is important to get your blood pressure checked regularly. Diet and lifestyle changes are important steps in preventing hypertension. By keeping your blood pressure in a healthy range, you may prevent complications like heart attack, heart failure, stroke, and kidney failure. Work with your health care provider to make a hypertension prevention plan that works for you. This information is  not intended to replace advice given to you by your health care provider. Make sure you discuss any questions you have with your health care provider. Document Revised: 09/10/2021 Document Reviewed: 09/10/2021 Elsevier Patient Education  2024 [employer] Inc. Fatty Liver Disease  The liver converts food into energy, removes toxic material from the blood, makes important proteins, and absorbs necessary vitamins from food. Fatty liver disease occurs when too much fat has built up in your liver cells. Fatty liver disease is also called hepatic steatosis. In many cases, fatty liver disease does not cause symptoms or problems. It is often diagnosed when tests are being done for other reasons. However, over time, fatty liver can cause inflammation that may lead to more serious liver problems, such as scarring of the liver (cirrhosis) and liver failure. Fatty liver is associated with insulin resistance, increased body fat, high blood pressure (hypertension), and high cholesterol. These are features of metabolic syndrome and increase your risk for stroke, diabetes, and heart disease. What are the causes? This condition may be caused by components of metabolic syndrome: Obesity. Insulin resistance. High cholesterol. Other causes: Alcohol abuse. Poor nutrition. Cushing syndrome. Pregnancy. Certain drugs. Poisons. Some viral infections. What increases the risk? You are more likely to develop this condition if you: Abuse alcohol. Are overweight. Have diabetes. Have hepatitis. Have a high triglyceride level. Are pregnant. What are the signs or symptoms? Fatty liver disease often does not cause symptoms. If symptoms do develop, they can include: Fatigue and weakness. Weight loss. Confusion. Nausea, vomiting, or abdominal pain. Yellowing of your skin and the white parts of your eyes (jaundice). Itchy skin. How is this diagnosed? This condition may be diagnosed by: A physical exam and your  medical history. Blood tests. Imaging tests, such as an ultrasound, CT scan, or MRI. A liver biopsy. A small sample of liver tissue is removed using a needle. The sample is then looked at under a microscope. How is this treated? Fatty liver disease is often caused by other health conditions. Treatment for fatty liver may involve medicines and lifestyle changes to manage conditions such as: Alcoholism. High cholesterol. Diabetes. Being overweight or obese. Follow these instructions at home:  Do not drink alcohol. If you have trouble quitting, ask your health care provider how to safely quit with the help of medicine or a supervised program. This is important to keep your condition from getting worse. Eat a healthy diet as told by your health care provider. Ask your health care provider about working with a dietitian to develop an eating plan. Exercise regularly. This can help you lose weight and control your cholesterol and diabetes. Talk to your health care provider about an exercise plan and which activities are best for you. Take over-the-counter and prescription medicines only as told by your health care provider. Keep all follow-up visits. This is important. Contact a health care provider if: You have trouble controlling your: Blood sugar. This is especially important if you have diabetes. Cholesterol. Drinking of alcohol.  Get help right away if: You have abdominal pain. You have jaundice. You have nausea and are vomiting. You vomit blood or material that looks like coffee grounds. You have stools that are black, tar-like, or bloody. Summary Fatty liver disease develops when too much fat builds up in the cells of your liver. Fatty liver disease often causes no symptoms or problems. However, over time, fatty liver can cause inflammation that may lead to more serious liver problems, such as scarring of the liver (cirrhosis). You are more likely to develop this condition if you abuse  alcohol, are pregnant, are overweight, have diabetes, have hepatitis, or have high triglyceride or cholesterol levels. Contact your health care provider if you have trouble controlling your blood sugar, cholesterol, or drinking of alcohol. This information is not intended to replace advice given to you by your health care provider. Make sure you discuss any questions you have with your health care provider. Document Revised: 09/04/2020 Document Reviewed: 09/04/2020 Elsevier Patient Education  2024 [employer] Inc. Liver Function Tests Why am I having this test? Liver function tests are done to see how well your liver is working. The proteins and enzymes measured in the tests can alert your health care provider to inflammation, damage, or disease in your liver. It is common to have liver function tests: When you are taking certain medicines. If you have liver disease. If you drink a lot of alcohol. During annual physical exams. When you have other conditions that may affect your liver. If you have symptoms such as yellowing of the skin (jaundice), abdominal pain, or nausea and vomiting. What is being tested? These tests measure various substances in your blood. This may include: Alanine aminotransferase (ALT). This is an enzyme in the liver. Aspartate aminotransferase (AST). This is an enzyme in the liver, heart, and muscles. Alkaline phosphatase (ALP). This is a protein in the liver, bile ducts, bone, and other body tissues. Total bilirubin. This is a yellow pigment in bile. Albumin. This is a protein in the liver. Prothrombin time and international normalized ratio (PT and INR). PT measures the time it takes for your blood to clot. INR is a calculation of blood clotting time based on your PT result. Total protein. This includes two proteins, albumin and globulin, found in the blood. What kind of sample is taken?  A blood sample is required for this test. It is usually collected by inserting a  needle into a blood vessel. How do I prepare for this test? How you prepare will depend on which tests are being done and the reason for doing them. You may need to: Avoid eating for 4-6 hours before the test, or as told by your health care provider. Follow instructions from your health care provider about eating or drinking restrictions before the tests. Stop taking certain medicines before your blood test, as told by your health care provider. Tell a health care provider about: All medicines you are taking, including vitamins, herbs, eye drops, creams, and over-the-counter medicines. Any medical conditions you have. Whether you are pregnant or may be pregnant. How are the results reported? Your test results will be reported as values. Your health care provider will compare your results to normal ranges that were established after testing a large group of people (reference ranges). Reference ranges may vary among labs and hospitals. For the substances measured in liver function tests, common reference ranges are: ALT Infant: 10-40 international units/L. Child or adult: 4-36 international units/L at 37C or 4-36 units/L (  SI units). Reference ranges may be higher for older adults. AST Newborn 38-82 days old: 35-140 units/L. Child younger than 75 years old: 15-60 units/L. 70-9 years old: 15-50 units/L. 27-65 years old: 10-50 units/L. 33-25 years old: 10-40 units/L. Adult: 0-35 units/L or 0-0.58 microkatals/L (SI units). Reference ranges may be higher for older adults. ALP Child younger than 55 years old: 85-235 units/L. 57-70 years old: 65-210 units/L. 18-52 years old: 60-300 units/L. 21-6 years old: 30-200 units/L. Adult: 30-120 units/L or 0.5-2.0 microkatals/L (SI units). Reference ranges may be higher for older adults. Total bilirubin Newborn: 1.0-12.0 mg/dL or 78.2-956 micromoles/L (SI units). Child or adult: 0.3-1.0 mg/dL or 2.1-30 micromoles/L. Albumin Premature infant: 3.0-4.2  g/dL. Newborn: 3.5-5.4 g/dL. Infant: 4.4-5.4 g/dL. Child: 4.0-5.9 g/dL. Adult: 3.5-5.0 g/dL or 86-57 g/L (SI units). PT 11.0-12.5 seconds; 85%-100%. INR 0.8-1.1. Total protein Premature infant: 4.2-7.6 g/dL. Newborn: 4.6-7.4 g/dL. Infant: 6.0-6.7 g/dL. Child: 6.2-8.0 g/dL. Adult: 6.4-8.3 g/dL or 84-69 g/L (SI units). What do the results mean? Results that are within the reference ranges are considered normal. For each substance measured, results outside the reference range can indicate various health issues. ALT Levels above the normal range may indicate liver disease. Sometimes levels also increase after burns, surgery, heart attack, muscle damage, or seizure. AST Levels above the normal range may indicate liver disease, skeletal muscle diseases, or other diseases that destroy the red blood cells or tissues of the pancreas. Levels below the normal range may indicate acute kidney disease, pregnancy, or diabetic ketoacidosis. ALP Levels above the normal range may be seen in biliary obstruction, liver diseases, bone disease, thyroid disease, lymphoma, or several other conditions. People with blood type O or B may show higher levels after a fatty meal. Levels below the normal range may indicate bone and teeth conditions, malnutrition, protein deficiency, or Wilson's disease. Total bilirubin Levels above the normal range may indicate problems with the liver, gallbladder, or bile ducts. Albumin Levels above the normal range may indicate dehydration. They may also be caused by a diet that is high in protein. Levels below the normal range may indicate kidney disease, liver disease, or malabsorption of nutrients. PT and INR Levels above the normal range mean that your blood is clotting slower than normal. This may be due to blood disorders, liver disorders, certain medicines like warfarin, or low levels of vitamin K. Total protein Levels above the normal range may be due to infection or  other diseases. Levels below the normal range may be due to an immune system disorder, bleeding, burns, kidney disorder, liver disease, trouble absorbing or getting nutrients, or other conditions that affect the intestines. Talk with your health care provider about what your results mean. Questions to ask your health care provider Ask your health care provider, or the department that is doing the test: When will my results be ready? How will I get my results? What are my treatment options? What other tests do I need? What are my next steps? Summary Liver function tests are done to see how well your liver is working. The results can alert your health care provider to inflammation, damage, or disease in your liver. You may need to avoid eating for 4-6 hours before the test or stop taking certain medicines before your blood test, as told by your health care provider. Talk with your health care provider about what your results mean. This information is not intended to replace advice given to you by your health care provider. Make sure you discuss any  questions you have with your health care provider. Document Revised: 08/25/2020 Document Reviewed: 08/26/2020 Elsevier Patient Education  2024 [employer].

## 2023-08-01 ENCOUNTER — Ambulatory Visit: Payer: Self-pay

## 2023-08-01 VITALS — BP 148/87 | HR 56

## 2023-08-01 DIAGNOSIS — R030 Elevated blood-pressure reading, without diagnosis of hypertension: Secondary | ICD-10-CM

## 2023-08-01 NOTE — Progress Notes (Signed)
Pt in today for a routine BP check.

## 2023-08-03 ENCOUNTER — Encounter: Payer: Self-pay | Admitting: Registered Nurse

## 2023-08-03 ENCOUNTER — Telehealth: Payer: Self-pay | Admitting: Registered Nurse

## 2023-08-03 DIAGNOSIS — R030 Elevated blood-pressure reading, without diagnosis of hypertension: Secondary | ICD-10-CM

## 2023-08-03 DIAGNOSIS — R519 Headache, unspecified: Secondary | ICD-10-CM

## 2023-08-03 NOTE — Telephone Encounter (Signed)
RN Chantel notified NP patient into clinic 08/01/23 for BP check and asking if she could receive headache medication.  BP slightly improved from previous.    BP 148/87 Important   (BP Location: Left Arm, Patient Position: Sitting, Cuff Size: Normal)   Pulse 56 Important    LMP 07/26/2023 (Exact Date)   SpO2 98%  Patient with history of headaches and has been on multiple medications recommended that she establish with PCM at last office visit and per epic patient has appt with 08/24/23 with Philip Aspen scheduled to establish care.  Discussed with RN Chantel to schedule patient appt with me 08/02/23 to discuss blood pressure medication options as readings above goal and it may be triggering headaches also.  If after lowering blood pressure no headache relief consider starting sumatriptan trial also.  Had discussed with patient at last encounter blood pressure was too elevated to start sumatriptan on that day and I requested she has blood pressure monitoring with clinic RN.  Patient agreed with plan of care at that time.

## 2023-08-10 ENCOUNTER — Ambulatory Visit: Payer: MEDICAID

## 2023-08-10 NOTE — Progress Notes (Signed)
S:  I would like to get my blood pressure checked.  They have been watching it somewhat and I have an appointment with a new PCP Peggye Pitt at [facility] on 08/24/23.  Yesterday I had a headache and a bit of dizziness as well so wanted to get the BP checked. O:  BP now 150/84.   \P:  Scheduled Lizzie to see the NP here at [facility] tomorrow at 11:00 am for assessment of blood pressure.  Reece Packer, RN, BSN, MPH, [facility]

## 2023-08-11 ENCOUNTER — Ambulatory Visit: Payer: MEDICAID | Admitting: Registered Nurse

## 2023-08-11 VITALS — BP 135/88 | HR 78 | Temp 98.2°F | Resp 16

## 2023-08-11 DIAGNOSIS — H6123 Impacted cerumen, bilateral: Secondary | ICD-10-CM

## 2023-08-11 DIAGNOSIS — R519 Headache, unspecified: Secondary | ICD-10-CM

## 2023-08-11 MED ORDER — SUMATRIPTAN SUCCINATE 100 MG PO TABS: ORAL_TABLET | ORAL | Status: AC
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
## 2023-08-12 ENCOUNTER — Other Ambulatory Visit: Payer: Self-pay

## 2023-08-12 MED ORDER — DEBROX 6.5 % OT SOLN
5.0000 [drp] | Freq: Two times a day (BID) | OTIC | 0 refills | Status: AC
  Filled 2023-08-12: qty 15, 30d supply, fill #0

## 2023-08-12 NOTE — Patient Instructions (Signed)
Migraine Headache A migraine headache is an intense pulsing or throbbing pain on one or both sides of the head. Migraine headaches may also cause other symptoms, such as nausea, vomiting, and sensitivity to light and noise. A migraine headache can last from 4 hours to 3 days. Talk with your health care provider about what things may bring on (trigger) your migraine headaches. What are the causes? The exact cause is not known. However, a migraine may be caused when nerves in the brain get irritated and release chemicals that cause blood vessels to become inflamed. This inflammation causes pain. Migraines may be triggered or caused by: Smoking. Medicines, such as: Nitroglycerin, which is used to treat chest pain. Birth control pills. Estrogen. Certain blood pressure medicines. Foods or drinks that contain nitrates, glutamate, aspartame, MSG, or tyramine. Certain foods or drinks, such as aged cheeses, chocolate, alcohol, or caffeine. Doing physical activity that is very hard. Other triggers may include: Menstruation. Pregnancy. Hunger. Stress. Getting too much or too little sleep. Weather changes. Tiredness (fatigue). What increases the risk? The following factors may make you more likely to have migraine headaches: Being between the ages of 36-86 years old. Being female. Having a family history of migraine headaches. Being Caucasian. Having a mental health condition, such as depression or anxiety. Being obese. What are the signs or symptoms? The main symptom of this condition is pulsing or throbbing pain. This pain may: Happen in any area of the head, such as on one or both sides. Make it hard to do daily activities. Get worse with physical activity. Get worse around bright lights, loud noises, or smells. Other symptoms may include: Nausea. Vomiting. Dizziness. Before a migraine headache starts, you may get warning signs (an aura). An aura may include: Seeing flashing lights or  having blind spots. Seeing bright spots, halos, or zigzag lines. Having tunnel vision or blurred vision. Having numbness or a tingling feeling. Having trouble talking. Having muscle weakness. After a migraine ends, you may have symptoms. These may include: Feeling tired. Trouble concentrating. How is this diagnosed? A migraine headache can be diagnosed based on: Your symptoms. A physical exam. Tests, such as: A CT scan or an MRI of the head. These tests can help rule out other causes of headaches. Taking fluid from the spine (lumbar puncture) to examine it (cerebrospinal fluid analysis, or CSF analysis). How is this treated? This condition may be treated with medicines that: Relieve pain and nausea. Prevent migraines. Treatment may also include: Acupuncture. Lifestyle changes like avoiding foods that trigger migraine headaches. Learning ways to control your body (biofeedback). Talk therapy to help you know and deal with negative thoughts (cognitive behavioral therapy). Follow these instructions at home: Medicines Take over-the-counter and prescription medicines only as told by your provider. Ask your provider if the medicine prescribed to you: Requires you to avoid driving or using machinery. Can cause constipation. You may need to take these actions to prevent or treat constipation: Drink enough fluid to keep your pee (urine) pale yellow. Take over-the-counter or prescription medicines. Eat foods that are high in fiber, such as beans, whole grains, and fresh fruits and vegetables. Limit foods that are high in fat and processed sugars, such as fried or sweet foods. Lifestyle  Do not drink alcohol. Do not use any products that contain nicotine or tobacco. These products include cigarettes, chewing tobacco, and vaping devices, such as e-cigarettes. If you need help quitting, ask your provider. Get 7-9 hours of sleep each night, or the  amount recommended by your provider. Find  ways to manage stress, such as meditation, deep breathing, or yoga. Try to exercise regularly. This can help lessen how bad and how often your migraines occur. General instructions Keep a journal to find out what triggers your migraines, so you can avoid those things. For example, write down: What you eat and drink. How much sleep you get. Any change to your diet or medicines. If you have a migraine headache: Avoid things that make your symptoms worse, such as bright lights. Lie down in a dark, quiet room. Do not drive or use machinery. Ask your provider what activities are safe for you while you have symptoms. Keep all follow-up visits. Your provider will monitor your symptoms and recommend any further treatment. Where to find more information Coalition for Headache and Migraine Patients ([facility]): headachemigraine.org American Migraine Foundation: americanmigrainefoundation.org National Headache Foundation: headaches.org Contact a health care provider if: You have symptoms that are different or worse than your usual migraine headache symptoms. You have more than 15 days of headaches in one month. Get help right away if: Your migraine headache becomes severe or lasts more than 72 hours. You have a fever or stiff neck. You have vision loss. Your muscles feel weak or like you cannot control them. You lose your balance often or have trouble walking. You faint. You have a seizure. This information is not intended to replace advice given to you by your health care provider. Make sure you discuss any questions you have with your health care provider. Document Revised: 07/19/2022 Document Reviewed: 07/19/2022 Elsevier Patient Education  2024 [employer] Form - Headache Record There are many types and causes of headaches. A headache record can help guide your treatment plan. Use this form to record the details. Bring this form with you to your follow-up visits. Follow your health care  provider's instructions on how to describe your headache. You may be asked to: Use a pain scale. This is a tool to rate the intensity of your headache using words or numbers. Describe what your headache feels like, such as dull, achy, throbbing, or sharp. Headache record Date: _______________ Time (from start to end): ____________________ Location of the headache: _________________________ Intensity of the headache: ____________________ Description of the headache: ______________________________________________________________ Hours of sleep the night before the headache: __________ Food or drinks before the headache started: ______________________________________________________________________________________ Events before the headache started: _______________________________________________________________________________________________ Symptoms before the headache started: __________________________________________________________________________________________ Symptoms during the headache: __________________________________________________________________________________________________ Treatment: ________________________________________________________________________________________________________________ Effect of treatment: _________________________________________________________________________________________________________ Other comments: ___________________________________________________________________________________________________________ Date: _______________ Time (from start to end): ____________________ Location of the headache: _________________________ Intensity of the headache: ____________________ Description of the headache: ______________________________________________________________ Hours of sleep the night before the headache: __________ Food or drinks before the headache started:  ______________________________________________________________________________________ Events before the headache started: ____________________________________________________________________________________________ Symptoms before the headache started: _________________________________________________________________________________________ Symptoms during the headache: _______________________________________________________________________________________________ Treatment: ________________________________________________________________________________________________________________ Effect of treatment: _________________________________________________________________________________________________________ Other comments: ___________________________________________________________________________________________________________ Date: _______________ Time (from start to end): ____________________ Location of the headache: _________________________ Intensity of the headache: ____________________ Description of the headache: ______________________________________________________________ Hours of sleep the night before the headache: __________ Food or drinks before the headache started: ______________________________________________________________________________________ Events before the headache started: ____________________________________________________________________________________________ Symptoms before the headache started: _________________________________________________________________________________________ Symptoms during the headache: _______________________________________________________________________________________________ Treatment: ________________________________________________________________________________________________________________ Effect of treatment: _________________________________________________________________________________________________________ Other  comments: ___________________________________________________________________________________________________________ Date: _______________ Time (from start to end): ____________________ Location of the headache: _________________________ Intensity of the headache: ____________________ Description of the headache: ______________________________________________________________ Hours of sleep the night before the headache: _________ Food or  drinks before the headache started: ______________________________________________________________________________________ Events before the headache started: ____________________________________________________________________________________________ Symptoms before the headache started: _________________________________________________________________________________________ Symptoms during the headache: _______________________________________________________________________________________________ Treatment: ________________________________________________________________________________________________________________ Effect of treatment: _________________________________________________________________________________________________________ Other comments: ___________________________________________________________________________________________________________ Date: _______________ Time (from start to end): ____________________ Location of the headache: _________________________ Intensity of the headache: ____________________ Description of the headache: ______________________________________________________________ Hours of sleep the night before the headache: _________ Food or drinks before the headache started: ______________________________________________________________________________________ Events before the headache started: ____________________________________________________________________________________________ Symptoms before the headache  started: _________________________________________________________________________________________ Symptoms during the headache: _______________________________________________________________________________________________ Treatment: ________________________________________________________________________________________________________________ Effect of treatment: _________________________________________________________________________________________________________ Other comments: ___________________________________________________________________________________________________________ This information is not intended to replace advice given to you by your health care provider. Make sure you discuss any questions you have with your health care provider. Document Revised: 04/22/2021 Document Reviewed: 04/22/2021 Elsevier Patient Education  2024 [employer] Earwax Buildup, Adult Your ears make something called earwax. It helps keep germs called bacteria away and protects the skin in your ears. Sometimes, too much earwax can build up. This can cause discomfort or make it harder to hear. What are the causes? Earwax buildup can happen when you have too much earwax in your ears. Earwax is made in the outer part of your ear canal. It's supposed to fall out in small amounts over time. But if your ears aren't able to clean themselves like they should, earwax can build up. What increases the risk? You're more likely to get earwax buildup if: You clean your ears with cotton swabs. You pick at your ears. You use earplugs or in-ear headphones a lot. You wear hearing aids. You may also be more likely to get it if: You're female. You're older. Your ears naturally make more earwax. You have narrow ear canals or extra hair in your ears. Your earwax is too thick or sticky. You have eczema. You're dehydrated. This means there's not enough fluid in your body. What are the signs or symptoms? Symptoms  of earwax buildup include: Not being able to hear as well. A feeling of fullness in your ear. Feeling like your ear is plugged. Fluid coming from your ear. Ear pain or an itchy ear. Ringing in your ear. Coughing or problems with balance. How is this diagnosed? Earwax buildup may be diagnosed based on your symptoms, medical history, and an ear exam. During the exam, your health care provider will look into your ear with a tool called an otoscope. You may also have tests, such as a hearing test. How is this treated? Earwax buildup may be treated by: Using ear drops. Having the earwax removed by a provider. The provider may: Flush the ear with water. Use a tool called a curette that has a loop on the end. Use a suction device. Having surgery. This may be done in severe cases. Follow these instructions at home:  Cleaning your ears Clean your ears as told by your provider. You can clean the outside of your ears with a washcloth or tissue. Do not overclean your ears. Do not put anything into your ear unless told. This includes cotton swabs. General instructions Take over-the-counter and prescription medicines only as told by your provider. Drink enough fluid to keep your pee (urine) pale yellow. This helps thin the earwax. If you have hearing aids, clean them as told. Keep all follow-up visits. If earwax builds up in your ears often or if you use hearing aids, ask your provider how often you should have your ears cleaned. Contact a health care provider if: Your ear pain gets  worse. You have a fever. You have pus, blood, or other fluid coming from your ear. You have hearing loss. You have ringing in your ears that won't go away. You feel like the room is spinning. This is called vertigo. Your symptoms don't get better with treatment. This information is not intended to replace advice given to you by your health care provider. Make sure you discuss any questions you have with your  health care provider. Document Revised: 02/03/2023 Document Reviewed: 02/03/2023 Elsevier Patient Education  2024 [employer].

## 2023-08-14 NOTE — Telephone Encounter (Signed)
See office note 08/11/23 and patient saw RN Reece Packer for BP check also.

## 2023-08-18 ENCOUNTER — Encounter: Payer: Self-pay | Admitting: Registered Nurse

## 2023-08-18 ENCOUNTER — Ambulatory Visit: Payer: Self-pay | Admitting: Registered Nurse

## 2023-08-18 VITALS — HR 70 | Resp 16

## 2023-08-18 DIAGNOSIS — Z23 Encounter for immunization: Secondary | ICD-10-CM

## 2023-08-18 DIAGNOSIS — S51839A Puncture wound without foreign body of unspecified forearm, initial encounter: Secondary | ICD-10-CM

## 2023-08-18 NOTE — Patient Instructions (Addendum)
Puncture Wound A puncture wound is an injury that is caused by a sharp, thin object that penetrates your skin. Usually, a puncture wound does not leave a large opening in your skin, so it may not bleed a lot. However, when you get a puncture wound, dirt or other materials (foreign bodies) can be forced into your wound and can break off inside. This increases the chance of infection, such as tetanus. There are many sharp, pointed objects that can cause puncture wounds, including teeth, nails, splinters of glass, fishhooks, and needles. Treatment may include washing out the wound with a germ-free (sterile) salt-water solution and having the wound opened surgically to remove a foreign object. The wound may be closed with stitches (sutures), skin glue, or adhesive strips and covered with antibiotic ointment and a bandage (dressing). Depending on what caused the injury, you may also need a tetanus shot or a rabies shot. Follow these instructions at home: Medicines Take or apply over-the-counter and prescription medicines only as told by your health care provider. If you were prescribed antibiotics, take or apply them as told by your health care provider. Do not stop using the antibiotic even if you start to feel better. Bathing Keep the dressing clean and dry as told by your health care provider. Do not take baths, swim, or use a hot tub until your health care provider approves. Ask your health care provider if you may take showers. You may only be allowed to take sponge baths. Wound care  Follow instructions from your health care provider about how to take care of your wound. Make sure you: Wash your hands with soap and water for at least 20 seconds before and after you change your dressing. If soap and water are not available, use hand sanitizer. Change your dressing as told by your health care provider. Leave sutures, skin glue, or adhesive strips in place. These skin closures may need to stay in place  for 2 weeks or longer. If adhesive strip edges start to loosen and curl up, you may trim the loose edges. Do not remove adhesive strips completely unless your health care provider tells you to do that. Clean the wound as told by your health care provider. Do not scratch or pick at the wound. Check your wound every day for signs of infection. Check for: Redness, swelling, or pain. Fluid or blood. Warmth. Pus or a bad smell. General instructions Raise (elevate) the injured area above the level of your heart while you are sitting or lying down. If your puncture wound is in your foot, ask your health care provider if you need to avoid putting weight on your foot and for how long. Do not use the injured limb to support your body weight until your health care provider says that you can. Use crutches as told by your health care provider. Keep all follow-up visits. This is important. Contact a health care provider if: You received a tetanus or rabies shot and you have swelling, severe pain, redness, or bleeding at the injection site. You have any of these signs of wound infection: Redness, swelling, or pain. Fluid or blood. Warmth. Pus or a bad smell. Your sutures come out. You notice something coming out of your wound, such as wood or glass. Your pain is not controlled with medicine. You develop numbness around your wound. Get help right away if: You develop severe swelling around your wound. You have a red streak going away from your wound. You develop painful skin  lumps. The wound is on your hand or foot and you: Cannot properly move a finger or toe. Notice that your fingers or toes look pale or bluish. Summary A puncture wound is an injury that is caused by a sharp, thin object that penetrates your skin. Treatment may include washing out the wound and having the wound opened surgically to remove a foreign object, The wound may be closed with stitches (sutures), skin glue, or adhesive  strips and covered with antibiotic ointment and a bandage (dressing). Follow instructions from your health care provider about how to take care of your wound. Contact a health care provider if you have redness, swelling, or pain at the site of your wound. This information is not intended to replace advice given to you by your health care provider. Make sure you discuss any questions you have with your health care provider. Document Revised: 12/23/2021 Document Reviewed: 12/23/2021 Elsevier Patient Education  2024 [employer]. Tdap (Tetanus, Diphtheria, Pertussis) Vaccine: What You Need to Know Many vaccine information statements are available in Spanish and other languages. See PromoAge.com.br. 1. Why get vaccinated? Tdap vaccine can prevent tetanus, diphtheria, and pertussis. Diphtheria and pertussis spread from person to person. Tetanus enters the body through cuts or wounds. TETANUS (T) causes painful stiffening of the muscles. Tetanus can lead to serious health problems, including being unable to open the mouth, having trouble swallowing and breathing, or death. DIPHTHERIA (D) can lead to difficulty breathing, heart failure, paralysis, or death. PERTUSSIS (aP), also known as "whooping cough," can cause uncontrollable, violent coughing that makes it hard to breathe, eat, or drink. Pertussis can be extremely serious especially in babies and young children, causing pneumonia, convulsions, brain damage, or death. In teens and adults, it can cause weight loss, loss of bladder control, passing out, and rib fractures from severe coughing. 2. Tdap vaccine Tdap is only for children 7 years and older, adolescents, and adults.  Adolescents should receive a single dose of Tdap, preferably at age 73 or 12 years. Pregnant people should get a dose of Tdap during every pregnancy, preferably during the early part of the third trimester, to help protect the newborn from pertussis. Infants are most at risk  for severe, life-threatening complications from pertussis. Adults who have never received Tdap should get a dose of Tdap. Also, adults should receive a booster dose of either Tdap or Td (a different vaccine that protects against tetanus and diphtheria but not pertussis) every 10 years, or after 5 years in the case of a severe or dirty wound or burn. Tdap may be given at the same time as other vaccines. 3. Talk with your health care provider Tell your vaccine provider if the person getting the vaccine: Has had an allergic reaction after a previous dose of any vaccine that protects against tetanus, diphtheria, or pertussis, or has any severe, life-threatening allergies Has had a coma, decreased level of consciousness, or prolonged seizures within 7 days after a previous dose of any pertussis vaccine (DTP, DTaP, or Tdap) Has seizures or another nervous system problem Has ever had Guillain-Barr Syndrome (also called "GBS") Has had severe pain or swelling after a previous dose of any vaccine that protects against tetanus or diphtheria In some cases, your health care provider may decide to postpone Tdap vaccination until a future visit. People with minor illnesses, such as a cold, may be vaccinated. People who are moderately or severely ill should usually wait until they recover before getting Tdap vaccine.  Your health  care provider can give you more information. 4. Risks of a vaccine reaction Pain, redness, or swelling where the shot was given, mild fever, headache, feeling tired, and nausea, vomiting, diarrhea, or stomachache sometimes happen after Tdap vaccination. People sometimes faint after medical procedures, including vaccination. Tell your provider if you feel dizzy or have vision changes or ringing in the ears.  As with any medicine, there is a very remote chance of a vaccine causing a severe allergic reaction, other serious injury, or death. 5. What if there is a serious problem? An  allergic reaction could occur after the vaccinated person leaves the clinic. If you see signs of a severe allergic reaction (hives, swelling of the face and throat, difficulty breathing, a fast heartbeat, dizziness, or weakness), call 9-1-1 and get the person to the nearest hospital. For other signs that concern you, call your health care provider.  Adverse reactions should be reported to the Vaccine Adverse Event Reporting System (VAERS). Your health care provider will usually file this report, or you can do it yourself. Visit the VAERS website at www.vaers.LAgents.no or call 610 823 5218. VAERS is only for reporting reactions, and VAERS staff members do not give medical advice. 6. The National Vaccine Injury Compensation Program The [employer] Vaccine Injury Compensation Program (VICP) is a federal program that was created to compensate people who may have been injured by certain vaccines. Claims regarding alleged injury or death due to vaccination have a time limit for filing, which may be as short as two years. Visit the VICP website at SpiritualWord.at or call 970-384-4206 to learn about the program and about filing a claim. 7. How can I learn more? Ask your health care provider. Call your local or state health department. Visit the website of the Food and Drug Administration (FDA) for vaccine package inserts and additional information at FinderList.no. Contact the Centers for Disease Control and Prevention (CDC): Call 762-488-3965 (1-800-CDC-INFO) or Visit CDC's website at PicCapture.uy. Source: CDC Vaccine Information Statement Tdap (Tetanus, Diphtheria, Pertussis) Vaccine (07/11/2020) This same material is available at FootballExhibition.com.br for no charge. This information is not intended to replace advice given to you by your health care provider. Make sure you discuss any questions you have with your health care provider. Document Revised:  03/09/2023 Document Reviewed: 01/07/2023 Elsevier Patient Education  2024 [employer].

## 2023-08-18 NOTE — Progress Notes (Signed)
Subjective:    Patient ID: Sonya Trujillo, female    DOB: 12/05/1976, 47 y.o.   MRN: [id number]  46y/o cacuasian female established patient reported puncture/cut to forearm from piece of broken porcelain in packing papers when trying to recycle papers.  Last tetanus vaccine greater than 10 years ago patient unsure of exact date.  Washed with soap and water and applied bandaid to wound prior to coming to clinic.  Bleeding stopped after applying bandaid.  Injury a couple hours prior to clinic staff notification of employee work injury.  Patient reported minimal pain.  Denied arm weakness has been working without difficulty since cut sustained.  Patient reported she did not trial sumatriptan and headache has resolved from last week also.      Review of Systems  Constitutional:  Negative for chills and fever.  HENT:  Negative for trouble swallowing and voice change.   Eyes:  Negative for photophobia and visual disturbance.  Respiratory:  Negative for shortness of breath, wheezing and stridor.   Cardiovascular:  Negative for chest pain.  Gastrointestinal:  Negative for diarrhea and vomiting.  Genitourinary:  Negative for difficulty urinating.  Musculoskeletal:  Negative for gait problem and myalgias.  Skin:  Positive for wound. Negative for color change, pallor and rash.  Neurological:  Negative for dizziness, tremors, syncope, facial asymmetry, speech difficulty, weakness and headaches.  Psychiatric/Behavioral:  Negative for agitation, confusion and sleep disturbance.        Objective:   Physical Exam Vitals reviewed.  Constitutional:      General: She is awake. She is not in acute distress.    Appearance: Normal appearance. She is well-developed, well-groomed and overweight. She is not ill-appearing, toxic-appearing or diaphoretic.  HENT:     Head: Normocephalic and atraumatic.     Jaw: There is normal jaw occlusion.     Salivary Glands: Right salivary gland is not diffusely  enlarged. Left salivary gland is not diffusely enlarged.     Right Ear: Hearing and external ear normal.     Left Ear: Hearing and external ear normal.     Nose: Nose normal. No congestion or rhinorrhea.     Mouth/Throat:     Lips: Pink. No lesions.     Mouth: Mucous membranes are moist. No oral lesions or angioedema.     Dentition: No gum lesions.     Tongue: No lesions. Tongue does not deviate from midline.     Palate: No mass and lesions.     Pharynx: Oropharynx is clear. Uvula midline.  Eyes:     General: Lids are normal. Vision grossly intact. Gaze aligned appropriately. Allergic shiner present. No scleral icterus.       Right eye: No discharge.        Left eye: No discharge.     Extraocular Movements: Extraocular movements intact.     Conjunctiva/sclera: Conjunctivae normal.     Pupils: Pupils are equal, round, and reactive to light.  Neck:     Trachea: Trachea normal.  Cardiovascular:     Rate and Rhythm: Normal rate and regular rhythm.     Pulses: Normal pulses.          Radial pulses are 2+ on the right side and 2+ on the left side.  Pulmonary:     Effort: Pulmonary effort is normal.     Breath sounds: Normal breath sounds and air entry. No stridor or transmitted upper airway sounds. No wheezing.     Comments: Spoke full sentences  without difficulty; no cough observed in exam room Abdominal:     General: Abdomen is flat.  Musculoskeletal:        General: Normal range of motion.     Right elbow: No swelling, deformity, effusion or lacerations. Normal range of motion.     Left elbow: No swelling, deformity, effusion or lacerations. Normal range of motion.     Right forearm: No swelling, edema, deformity or lacerations.     Left forearm: No swelling, edema, deformity or lacerations.     Right hand: Normal strength. Normal capillary refill.     Left hand: Normal strength. Normal capillary refill.     Cervical back: Normal range of motion and neck supple. No swelling, edema,  deformity, erythema, signs of trauma, lacerations, rigidity, torticollis or crepitus.     Thoracic back: No swelling, edema, deformity, signs of trauma or lacerations.  Lymphadenopathy:     Head:     Right side of head: No submandibular or preauricular adenopathy.     Left side of head: No submandibular or preauricular adenopathy.     Cervical:     Right cervical: No superficial cervical adenopathy.    Left cervical: No superficial cervical adenopathy.  Skin:    General: Skin is warm and dry.     Capillary Refill: Capillary refill takes less than 2 seconds.     Coloration: Skin is not ashen, cyanotic, jaundiced, mottled, pale or sallow.     Findings: Wound present. No abrasion, abscess, acne, bruising, burn, ecchymosis, erythema, signs of injury, laceration, lesion, petechiae or rash.     Nails: There is no clubbing.     Comments: Forearm puncture wound bandaid clean dry and intact  Neurological:     General: No focal deficit present.     Mental Status: She is alert and oriented to person, place, and time. Mental status is at baseline.     GCS: GCS eye subscore is 4. GCS verbal subscore is 5. GCS motor subscore is 6.     Cranial Nerves: No cranial nerve deficit, dysarthria or facial asymmetry.     Motor: Motor function is intact. No weakness, tremor, atrophy, abnormal muscle tone or seizure activity.     Coordination: Coordination is intact. Coordination normal.     Gait: Gait is intact. Gait normal.     Comments: In/out of chair without difficulty; gait sure and steady in clinic; bilateral hand grasp equal 5/5  Psychiatric:        Attention and Perception: Attention and perception normal.        Mood and Affect: Mood and affect normal.        Speech: Speech normal.        Behavior: Behavior normal. Behavior is cooperative.        Thought Content: Thought content normal.        Cognition and Memory: Cognition and memory normal.        Judgment: Judgment normal.            Assessment & Plan:   A-puncture wound of forearm initial encounter. Need to tdap vaccine  P-[facility] and Epic reviewed no tetanus vaccines noted either system and patient reported greater than 10 years since last booster.  Given VIS tdap boostrix and notified of common possible side effects.  Administered IM left deltoid see MAR.  Discussed with patient may use tylenol 1000mg  po q6h prn pain/fever or ibuprofen 800mg  po q8h prn pain/fever take with food.  Patient discharged ambulatory bandaid  clean dry and intact left deltoid A&Ox3 gait sure and steady respirations even and unlabored RA skin warm dry and pink.  Patient was instructed do not soak arm in dirty water until puncture healed avoid pool, lake, hot tub, dirty sink water. May shower apply neosporin keep wounds covered they will heal faster and prevent contamination rubbing from clothing tearing off scabs.  change at least daily after washing with soap and water.  Change prn soiling. Exitcare handout on tdap vis and puncture. Medications as directed. Call or return to clinic as needed if these symptoms worsen or fail to improve as anticipated or notify NP if signs of infection e.g. worsening pain/swelling/purulent discharge/fever. Patient verbalized agreement and understanding of treatment plan. P2: ROM, injury prevention

## 2023-08-24 ENCOUNTER — Ambulatory Visit: Payer: MEDICAID | Admitting: Internal Medicine

## 2023-09-01 NOTE — Progress Notes (Signed)
Patient seen by RN Audria Nine for BP check improving but still elevated.  Discussed with patient 08/02/23

## 2023-09-01 NOTE — Progress Notes (Signed)
Pt saw Albina Billet, NP saw pt in clinic and discussed results.

## 2023-09-13 ENCOUNTER — Ambulatory Visit (INDEPENDENT_AMBULATORY_CARE_PROVIDER_SITE_OTHER): Payer: No Typology Code available for payment source | Admitting: Family Medicine

## 2023-09-13 ENCOUNTER — Ambulatory Visit: Payer: No Typology Code available for payment source | Attending: Family Medicine

## 2023-09-13 ENCOUNTER — Encounter: Payer: Self-pay | Admitting: Family Medicine

## 2023-09-13 VITALS — BP 162/96 | HR 50 | Temp 98.4°F | Ht 66.0 in | Wt 236.8 lb

## 2023-09-13 DIAGNOSIS — R079 Chest pain, unspecified: Secondary | ICD-10-CM

## 2023-09-13 DIAGNOSIS — Z800 Family history of malignant neoplasm of digestive organs: Secondary | ICD-10-CM | POA: Diagnosis not present

## 2023-09-13 DIAGNOSIS — Z1211 Encounter for screening for malignant neoplasm of colon: Secondary | ICD-10-CM

## 2023-09-13 DIAGNOSIS — R001 Bradycardia, unspecified: Secondary | ICD-10-CM

## 2023-09-13 DIAGNOSIS — H6123 Impacted cerumen, bilateral: Secondary | ICD-10-CM

## 2023-09-13 DIAGNOSIS — H9193 Unspecified hearing loss, bilateral: Secondary | ICD-10-CM

## 2023-09-13 DIAGNOSIS — R002 Palpitations: Secondary | ICD-10-CM

## 2023-09-13 DIAGNOSIS — I10 Essential (primary) hypertension: Secondary | ICD-10-CM | POA: Insufficient documentation

## 2023-09-13 DIAGNOSIS — Z124 Encounter for screening for malignant neoplasm of cervix: Secondary | ICD-10-CM

## 2023-09-13 DIAGNOSIS — Z1231 Encounter for screening mammogram for malignant neoplasm of breast: Secondary | ICD-10-CM

## 2023-09-13 DIAGNOSIS — Z7689 Persons encountering health services in other specified circumstances: Secondary | ICD-10-CM

## 2023-09-13 DIAGNOSIS — H9313 Tinnitus, bilateral: Secondary | ICD-10-CM

## 2023-09-13 MED ORDER — OLMESARTAN MEDOXOMIL 20 MG PO TABS: 20.0000 mg | ORAL_TABLET | Freq: Every day | ORAL | 0 refills | Status: DC

## 2023-09-13 NOTE — Patient Instructions (Addendum)
-  It was a pleasure to meet you and look forward to taking care of you.  -START Olmesartan 20mg  tablet, take 1 tablet daily. Prefer to take at night time to help with possible side effects, such as dizziness.  -Recommend to take your blood pressure once in the AM and once in the PM for the next two weeks. Write down your readings and bring to your next appointment.  -Ordered a heart monitor for complaints of chest pain, palpitations, and bradycardia seen on EKG. If you have not received the heart monitor within a week, please call the office or send a MyChart message.  -Placed a referral to cardiology for complaints of chest pain, palpitations, bradycardia seen on EKG. If have chest pain, shortness of breath, dizziness, or lightheadedness, recommend you go directly to the emergency department.  -Placed a referral to GYN for pap smear and GI for colonoscopy.  -Ordered mammogram.  -Both ears are impacted with cerumen. Recommend to use Debrox. Will check ears at next appointment. If not resolved, will perform bilateral ear lavage.  -Referral placed to a audiology for tinnitus and decreased hearing.  -Please call back to the office or send a MyChart message if you do not receive a phone call or MyChart message in 2 weeks concerning referrals and orders for mammogram.
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
## 2023-09-13 NOTE — Assessment & Plan Note (Addendum)
Uncontrolled. New onset. Patients blood pressure is elevated today and has been elevated recently in August with employer. Prescribed Olmesartan 20mg  daily, recommend to take a night time to help with possible side effects, such as dizziness. Recommend to monitor blood pressure BID and bring in readings at next appointment. CMP was completed on 08/13 with normal kidney protection.

## 2023-09-15 ENCOUNTER — Ambulatory Visit: Payer: Self-pay | Admitting: Registered Nurse

## 2023-09-15 ENCOUNTER — Ambulatory Visit: Payer: Self-pay

## 2023-09-15 ENCOUNTER — Encounter: Payer: Self-pay | Admitting: Registered Nurse

## 2023-09-15 VITALS — BP 152/97 | HR 46 | Temp 98.2°F

## 2023-09-15 DIAGNOSIS — R079 Chest pain, unspecified: Secondary | ICD-10-CM

## 2023-09-15 DIAGNOSIS — I10 Essential (primary) hypertension: Secondary | ICD-10-CM

## 2023-09-15 DIAGNOSIS — Z6838 Body mass index (BMI) 38.0-38.9, adult: Secondary | ICD-10-CM

## 2023-09-15 NOTE — Progress Notes (Signed)
Subjective:    Patient ID: Sonya Trujillo, female    DOB: [birth year], 47 y.o.   MRN: [id number]  46y/o established caucasian female here for evaluation dizzyness and chest tightness at work.  Saw PCM earlier this week given Rx for olmesartan planned to pick up and start after work today.  Noticing more floaters in vision today.  Some nasal congestion/post nasal drip.  Denied loss of vision, worst headache of life, jaw pain, left arm pain, feeling like elephant on chest, hemoptysis, hand/feet swelling, fever or chills.  Had some nausea/vomiting yesterday am along with some lethargy.  Reported shortness of breath with exertion at work today.  Stayed home from work yesterday.  Took BP at home and reported it was elevated  174/110 this morning . I am feeling better and am at work today. Still lethargic, though. I had a doctor's appointment Tuesday afternoon. They did an EKG at [facility] office also normal except for slow pulse per patient.  Patient tolerating po intake without difficulty today had chicken biscuit for breakfast.  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      Review of Systems  Constitutional:  Positive for fatigue. Negative for chills, diaphoresis and fever.  HENT:  Positive for congestion and postnasal drip. Negative for ear discharge, ear pain, facial swelling, hearing loss, mouth sores, nosebleeds, rhinorrhea, sinus pressure, sinus pain, sneezing, trouble swallowing and voice change.   Eyes:  Negative for photophobia, pain, discharge, redness and itching.  Respiratory:  Positive for chest tightness and shortness of breath. Negative for cough, choking, wheezing and stridor.   Cardiovascular:  Positive for chest pain. Negative for palpitations and leg swelling.  Gastrointestinal:  Positive for nausea and vomiting. Negative for abdominal distention, abdominal pain, blood in stool, constipation, diarrhea and rectal pain.  Genitourinary:  Negative for difficulty urinating.   Musculoskeletal:  Negative for arthralgias, gait problem, myalgias, neck pain and neck stiffness.  Skin:  Negative for color change, rash and wound.  Allergic/Immunologic: Positive for environmental allergies. Negative for food allergies.  Neurological:  Positive for dizziness. Negative for tremors, seizures, syncope, facial asymmetry, speech difficulty, weakness, light-headedness, numbness and headaches.  Hematological:  Negative for adenopathy. Does not bruise/bleed easily.  Psychiatric/Behavioral:  Negative for agitation, confusion and sleep disturbance.        Objective:   Physical Exam Vitals and nursing note reviewed.  Constitutional:      General: She is awake. She is not in acute distress.    Appearance: Normal appearance. She is well-developed and well-groomed. She is obese. She is not ill-appearing, toxic-appearing or diaphoretic.  HENT:     Head: Normocephalic and atraumatic.     Jaw: There is normal jaw occlusion.     Salivary Glands: Right salivary gland is not diffusely enlarged or tender. Left salivary gland is not diffusely enlarged or tender.     Right Ear: Hearing, ear canal and external ear normal. No decreased hearing noted. No laceration, drainage, swelling or tenderness. There is impacted cerumen. No foreign body.     Left Ear: Hearing, ear canal and external ear normal. No decreased hearing noted. No laceration, drainage, swelling or tenderness. There is impacted cerumen. No foreign body.     Ears:     Comments: Gold soft wax completely occluding bilateral canals/TMs proximal to TMs; distal canal without debris/wax/erythema/tenderness wearing headphones on neck    Nose: Nose normal. No congestion or rhinorrhea.     Right Turbinates: Enlarged. Not swollen or pale.  Left Turbinates: Enlarged. Not swollen or pale.     Right Sinus: No maxillary sinus tenderness or frontal sinus tenderness.     Left Sinus: No maxillary sinus tenderness or frontal sinus tenderness.      Comments: Bilateral allergic shiners; bilateral nasal turbinates edema with clear discharge; cobblestoning posterior pharynx    Mouth/Throat:     Lips: Pink. No lesions.     Mouth: Mucous membranes are moist. No oral lesions or angioedema.     Dentition: No gum lesions.     Tongue: No lesions. Tongue does not deviate from midline.     Palate: No mass and lesions.     Pharynx: Uvula midline. Pharyngeal swelling and postnasal drip present. No oropharyngeal exudate, posterior oropharyngeal erythema or uvula swelling.     Tonsils: No tonsillar exudate or tonsillar abscesses.  Eyes:     General: Lids are normal. Vision grossly intact. Gaze aligned appropriately. Allergic shiner present. No scleral icterus.       Right eye: No discharge.        Left eye: No discharge.     Extraocular Movements: Extraocular movements intact.     Conjunctiva/sclera: Conjunctivae normal.     Pupils: Pupils are equal, round, and reactive to light.  Neck:     Trachea: Trachea and phonation normal.  Cardiovascular:     Rate and Rhythm: Regular rhythm. Bradycardia present.     Chest Wall: PMI is not displaced.     Pulses: Normal pulses.          Radial pulses are 2+ on the right side and 2+ on the left side.     Heart sounds: Normal heart sounds, S1 normal and S2 normal. Heart sounds not distant. No murmur heard.    No systolic murmur is present.     No diastolic murmur is present.     No friction rub. No gallop.  Pulmonary:     Effort: Pulmonary effort is normal.     Breath sounds: Normal breath sounds and air entry. No stridor or transmitted upper airway sounds. No wheezing.     Comments: Spoke full sentences without difficulty; no cough observed in exam room Chest:     Chest wall: No mass, lacerations, deformity, swelling, tenderness, crepitus or edema. There is no dullness to percussion.     Comments: No intercostal/epigastric or rib tenderness to palpation Abdominal:     General: Abdomen is flat. There  is no distension.     Palpations: Abdomen is soft.     Tenderness: There is no abdominal tenderness. There is no guarding.  Musculoskeletal:        General: Normal range of motion.     Right hand: Normal strength. Normal capillary refill.     Left hand: Normal strength. Normal capillary refill.     Cervical back: Normal range of motion and neck supple. No swelling, edema, deformity, erythema, signs of trauma, lacerations, rigidity, spasms, torticollis, tenderness or crepitus. No pain with movement or muscular tenderness. Normal range of motion.     Thoracic back: No swelling, edema, deformity, signs of trauma, lacerations, spasms or tenderness. Normal range of motion. No scoliosis.     Right lower leg: No edema.     Left lower leg: No edema.  Lymphadenopathy:     Head:     Right side of head: No submental, submandibular, tonsillar, preauricular, posterior auricular or occipital adenopathy.     Left side of head: No submental, submandibular, tonsillar, preauricular, posterior auricular  or occipital adenopathy.     Cervical: No cervical adenopathy.     Right cervical: No superficial, deep or posterior cervical adenopathy.    Left cervical: No superficial, deep or posterior cervical adenopathy.     Upper Body:     Right upper body: No supraclavicular adenopathy.     Left upper body: No supraclavicular adenopathy.  Skin:    General: Skin is warm and dry.     Capillary Refill: Capillary refill takes less than 2 seconds.     Coloration: Skin is not ashen, cyanotic, jaundiced, mottled, pale or sallow.     Findings: No abrasion, abscess, acne, bruising, burn, ecchymosis, erythema, signs of injury, laceration, lesion, petechiae, rash or wound.     Nails: There is no clubbing.  Neurological:     General: No focal deficit present.     Mental Status: She is alert and oriented to person, place, and time. Mental status is at baseline.     GCS: GCS eye subscore is 4. GCS verbal subscore is 5. GCS  motor subscore is 6.     Cranial Nerves: Cranial nerves 2-12 are intact. No cranial nerve deficit, dysarthria or facial asymmetry.     Sensory: Sensation is intact.     Motor: Motor function is intact. No weakness, tremor, atrophy, abnormal muscle tone or seizure activity.     Coordination: Coordination is intact. Coordination normal.     Gait: Gait is intact. Gait normal.     Comments: In/out of chair without difficulty; gait sure and steady in clinic; bilateral hand grasp equal 5/5  Psychiatric:        Attention and Perception: Attention and perception normal.        Mood and Affect: Mood and affect normal.        Speech: Speech normal.        Behavior: Behavior normal. Behavior is cooperative.        Thought Content: Thought content normal.        Cognition and Memory: Cognition and memory normal.        Judgment: Judgment normal.     VS obtained during RN Visit prior to NP arrival for nurse visit pulse RRR pain 3/10 chest improved from earlier in the day  BP 152/97 Important   (BP Location: Left Arm, Patient Position: Sitting, Cuff Size: Normal)   Pulse 46 Important    Temp 98.2 F (36.8 C)   LMP 09/11/2023 (Exact Date)   SpO2 98%   RN Chantal reported patient respiratory rate has decreased while sitting and relaxing waiting for BP check and NP to arrive in clinic initially was speaking in short phrases on arrival  1945 spoke with patient via telephone stated left work after office visit. Picked up olmesartan Rx at pharmacy took it then a nap and when she rechecked blood pressure this afternoon 115/72 HR 50s  chest pain resolved feeling much better. She avoided further caffeine this afternoon and eating without difficulty.  Discussed with patient may have am caffeine her usual amount if blood pressure not elevated.  Patient may send my chart message or email to [email or web address] if questions or concerns at work tomorrow.  Red flags/ER precautions continue e.g. if left arm  pain, jaw pain, worsening chest pain, worst headache of life, diaphoresis at rest   Continue olmesartan per PCM instructions and keep scheduled follow up appt with PCM. Patient A&Ox3 spoke full sentences without difficulty respirations even and unlabored no audible cough/congestion/wheezing/throat clearing during 2  minute call  Patient agreed with plan of care and had no further questions at this time.  HR/supervisor notified cleared to be onsite/work 09/16/2023.      Assessment & Plan:  A-essential hypertension, BMI 38, unspecified chest pain  P-Recommended patient go home start olmesartan now low sodium diet/DASH diet and hold all caffeine this afternoon and until blood pressure less than 140/90.  Discussed with patient GI upset yesterday may have viral illness that is circulating in community.  Avoid dehydration drink water to keep urine pale yellow clear and voiding every 2-4 hours when awake.  Illness/worrying that blood pressure high/salty breakfast/exertion at work all increase blood pressure.  Discussed olmesartan time of onset 1-2 hours.  Recheck BP later this afternoon 4 hours.  If still high notify NP via email or my chart. ER if worsening chest  pain, diaphoresis, jaw pain, left arm pain, shortness of breath at rest today or call 911 do not drive if visual changes/chest pain worsening Discussed ER has medications and lab tests that UC does not have nor does PCM to rule out AMI.  Discussed I do not have the troponin labs/IV BP or fast acting BP meds/EKG machine here to do rule out heart attack in this clinic.  Discussed I think her symptoms due to her very elevated blood pressure at this time and best course of action to stop exertion, take her medication and rest the rest of today.  If no improvement or worsening in symptoms then re-evaluation at ER.  Continue BP checks at home and prn with RN in clinic especially if not feeling well at work. Spoke with supervisor recommended that patient go  home for personal medical reasons at this time.  HR notified patient seen in clinic and was absent yesterday due to illness and recommended that she go home today to rest. Exitcare handouts on chest pain, managing hypertension and DASH diet patient refused printed copy Patient agreed with plan of care and had no further questions at this time.  Continue working on making healthy dietary choices, 150 minutes activity per week and weight loss over the next year.

## 2023-09-15 NOTE — Progress Notes (Signed)
Pt reports chest tightness that started this AM and some SOB that is exacerbated with walking. Pt states that she had Nausea and vomiting yesterday. Pt reports that she has had "floaters" with her vision "for a few weeks". Denies abdominal pain, jaw pain or arm pain at this time.      Ilean Skill, NP in clinic to access pt at this time.

## 2023-09-15 NOTE — Patient Instructions (Signed)
Nonspecific Chest Pain, Adult Chest pain is an uncomfortable, tight, or painful feeling in the chest. The pain can feel like a crushing, aching, or squeezing pressure. A person can feel a burning or tingling sensation. Chest pain can also be felt in your back, neck, jaw, shoulder, or arm. This pain can be worse when you move, sneeze, or take a deep breath. Chest pain can be caused by a condition that is life-threatening. This must be treated right away. It can also be caused by something that is not life-threatening. If you have chest pain, it can be hard to know the difference, so it is important to get help right away to make sure that you do not have a serious condition. Some life-threatening causes of chest pain include: Heart attack. A tear in the body's main blood vessel (aortic dissection). Inflammation around your heart (pericarditis). A problem in the lungs, such as a blood clot (pulmonary embolism) or a collapsed lung (pneumothorax). Some non life-threatening causes of chest pain include: Heartburn. Anxiety or stress. Damage to the bones, muscles, and cartilage that make up your chest wall. Pneumonia or bronchitis. Shingles infection (varicella-zoster virus). Your chest pain may come and go. It may also be constant. Your health care provider will do tests and other studies to find the cause of your pain. Treatment will depend on the cause of your chest pain. Follow these instructions at home: Medicines Take over-the-counter and prescription medicines only as told by your health care provider. If you were prescribed an antibiotic medicine, take it as told by your health care provider. Do not stop taking the antibiotic even if you start to feel better. Activity Avoid any activities that cause chest pain. Do not lift anything that is heavier than 10 lb (4.5 kg), or the limit that you are told, until your health care provider says that it is safe. Rest as directed by your health care  provider. Return to your normal activities only as told by your health care provider. Ask your health care provider what activities are safe for you. Lifestyle     Do not use any products that contain nicotine or tobacco, such as cigarettes, e-cigarettes, and chewing tobacco. If you need help quitting, ask your health care provider. Do not drink alcohol. Make healthy lifestyle changes as recommended. These may include: Getting regular exercise. Ask your health care provider to suggest some exercises that are safe for you. Eating a heart-healthy diet. This includes plenty of fresh fruits and vegetables, whole grains, low-fat (lean) protein, and low-fat dairy products. A dietitian can help you find healthy eating options. Maintaining a healthy weight. Managing any other health conditions you may have, such as high blood pressure (hypertension) or diabetes. Reducing stress, such as with yoga or relaxation techniques. General instructions Pay attention to any changes in your symptoms. It is up to you to get the results of any tests that were done. Ask your health care provider, or the department that is doing the tests, when your results will be ready. Keep all follow-up visits as told by your health care provider. This is important. You may be asked to go for further testing if your chest pain does not go away. Contact a health care provider if: Your chest pain does not go away. You feel depressed. You have a fever. You notice changes in your symptoms or develop new symptoms. Get help right away if: Your chest pain gets worse. You have a cough that gets worse, or you  cough up blood. You have severe pain in your abdomen. You faint. You have sudden, unexplained chest discomfort. You have sudden, unexplained discomfort in your arms, back, neck, or jaw. You have shortness of breath at any time. You suddenly start to sweat, or your skin gets clammy. You feel nausea or you vomit. You  suddenly feel lightheaded or dizzy. You have severe weakness, or unexplained weakness or fatigue. Your heart begins to beat quickly, or it feels like it is skipping beats. These symptoms may represent a serious problem that is an emergency. Do not wait to see if the symptoms will go away. Get medical help right away. Call your local emergency services (911 in the [place] Do not drive yourself to the hospital. Summary Chest pain can be caused by a condition that is serious and requires urgent treatment. It may also be caused by something that is not life-threatening. Your health care provider may do lab tests and other studies to find the cause of your pain. Follow your health care provider's instructions on taking medicines, making lifestyle changes, and getting emergency treatment if symptoms become worse. Keep all follow-up visits as told by your health care provider. This includes visits for any further testing if your chest pain does not go away. This information is not intended to replace advice given to you by your health care provider. Make sure you discuss any questions you have with your health care provider. Document Revised: 10/07/2022 Document Reviewed: 10/07/2022 Elsevier Patient Education  2024 [employer] DASH Eating Plan DASH stands for Dietary Approaches to Stop Hypertension. The DASH eating plan is a healthy eating plan that has been shown to: Lower high blood pressure (hypertension). Reduce your risk for type 2 diabetes, heart disease, and stroke. Help with weight loss. What are tips for following this plan? Reading food labels Check food labels for the amount of salt (sodium) per serving. Choose foods with less than 5 percent of the Daily Value (DV) of sodium. In general, foods with less than 300 milligrams (mg) of sodium per serving fit into this eating plan. To find whole grains, look for the word "whole" as the first word in the ingredient list. Shopping Buy products  labeled as "low-sodium" or "no salt added." Buy fresh foods. Avoid canned foods and pre-made or frozen meals. Cooking Try not to add salt when you cook. Use salt-free seasonings or herbs instead of table salt or sea salt. Check with your health care provider or pharmacist before using salt substitutes. Do not fry foods. Cook foods in healthy ways, such as baking, boiling, grilling, roasting, or broiling. Cook using oils that are good for your heart. These include olive, canola, avocado, soybean, and sunflower oil. Meal planning  Eat a balanced diet. This should include: 4 or more servings of fruits and 4 or more servings of vegetables each day. Try to fill half of your plate with fruits and vegetables. 6-8 servings of whole grains each day. 6 or less servings of lean meat, poultry, or fish each day. 1 oz is 1 serving. A 3 oz (85 g) serving of meat is about the same size as the palm of your hand. One egg is 1 oz (28 g). 2-3 servings of low-fat dairy each day. One serving is 1 cup (237 mL). 1 serving of nuts, seeds, or beans 5 times each week. 2-3 servings of heart-healthy fats. Healthy fats called omega-3 fatty acids are found in foods such as walnuts, flaxseeds, fortified milks, and eggs. These  fats are also found in cold-water fish, such as sardines, salmon, and mackerel. Limit how much you eat of: Canned or prepackaged foods. Food that is high in trans fat, such as fried foods. Food that is high in saturated fat, such as fatty meat. Desserts and other sweets, sugary drinks, and other foods with added sugar. Full-fat dairy products. Do not salt foods before eating. Do not eat more than 4 egg yolks a week. Try to eat at least 2 vegetarian meals a week. Eat more home-cooked food and less restaurant, buffet, and fast food. Lifestyle When eating at a restaurant, ask if your food can be made with less salt or no salt. If you drink alcohol: Limit how much you have to: 0-1 drink a day if you  are female. 0-2 drinks a day if you are female. Know how much alcohol is in your drink. In the [place] one drink is one 12 oz bottle of beer (355 mL), one 5 oz glass of wine (148 mL), or one 1 oz glass of hard liquor (44 mL). General information Avoid eating more than 2,300 mg of salt a day. If you have hypertension, you may need to reduce your sodium intake to 1,500 mg a day. Work with your provider to stay at a healthy body weight or lose weight. Ask what the best weight range is for you. On most days of the week, get at least 30 minutes of exercise that causes your heart to beat faster. This may include walking, swimming, or biking. Work with your provider or dietitian to adjust your eating plan to meet your specific calorie needs. What foods should I eat? Fruits All fresh, dried, or frozen fruit. Canned fruits that are in their natural juice and do not have sugar added to them. Vegetables Fresh or frozen vegetables that are raw, steamed, roasted, or grilled. Low-sodium or reduced-sodium tomato and vegetable juice. Low-sodium or reduced-sodium tomato sauce and tomato paste. Low-sodium or reduced-sodium canned vegetables. Grains Whole-grain or whole-wheat bread. Whole-grain or whole-wheat pasta. Brown rice. Orpah Cobb. Bulgur. Whole-grain and low-sodium cereals. Pita bread. Low-fat, low-sodium crackers. Whole-wheat flour tortillas. Meats and other proteins Skinless chicken or [place]. Ground chicken or [place]. Pork with fat trimmed off. Fish and seafood. Egg whites. Dried beans, peas, or lentils. Unsalted nuts, nut butters, and seeds. Unsalted canned beans. Lean cuts of beef with fat trimmed off. Low-sodium, lean precooked or cured meat, such as sausages or meat loaves. Dairy Low-fat (1%) or fat-free (skim) milk. Reduced-fat, low-fat, or fat-free cheeses. Nonfat, low-sodium ricotta or cottage cheese. Low-fat or nonfat yogurt. Low-fat, low-sodium cheese. Fats and oils Soft margarine without  trans fats. Vegetable oil. Reduced-fat, low-fat, or light mayonnaise and salad dressings (reduced-sodium). Canola, safflower, olive, avocado, soybean, and sunflower oils. Avocado. Seasonings and condiments Herbs. Spices. Seasoning mixes without salt. Other foods Unsalted popcorn and pretzels. Fat-free sweets. The items listed above may not be all the foods and drinks you can have. Talk to a dietitian to learn more. What foods should I avoid? Fruits Canned fruit in a light or heavy syrup. Fried fruit. Fruit in cream or butter sauce. Vegetables Creamed or fried vegetables. Vegetables in a cheese sauce. Regular canned vegetables that are not marked as low-sodium or reduced-sodium. Regular canned tomato sauce and paste that are not marked as low-sodium or reduced-sodium. Regular tomato and vegetable juices that are not marked as low-sodium or reduced-sodium. Rosita Fire. Olives. Grains Baked goods made with fat, such as croissants, muffins, or some breads. Dry  pasta or rice meal packs. Meats and other proteins Fatty cuts of meat. Ribs. Fried meat. Tomasa Blase. Bologna, salami, and other precooked or cured meats, such as sausages or meat loaves, that are not lean and low in sodium. Fat from the back of a pig (fatback). Bratwurst. Salted nuts and seeds. Canned beans with added salt. Canned or smoked fish. Whole eggs or egg yolks. Chicken or [place] with skin. Dairy Whole or 2% milk, cream, and half-and-half. Whole or full-fat cream cheese. Whole-fat or sweetened yogurt. Full-fat cheese. Nondairy creamers. Whipped toppings. Processed cheese and cheese spreads. Fats and oils Butter. Stick margarine. Lard. Shortening. Ghee. Bacon fat. Tropical oils, such as coconut, palm kernel, or palm oil. Seasonings and condiments Onion salt, garlic salt, seasoned salt, table salt, and sea salt. Worcestershire sauce. Tartar sauce. Barbecue sauce. Teriyaki sauce. Soy sauce, including reduced-sodium soy sauce. Steak sauce. Canned  and packaged gravies. Fish sauce. Oyster sauce. Cocktail sauce. Store-bought horseradish. Ketchup. Mustard. Meat flavorings and tenderizers. Bouillon cubes. Hot sauces. Pre-made or packaged marinades. Pre-made or packaged taco seasonings. Relishes. Regular salad dressings. Other foods Salted popcorn and pretzels. The items listed above may not be all the foods and drinks you should avoid. Talk to a dietitian to learn more. Where to find more information National Heart, Lung, and Blood Institute (NHLBI): BuffaloDryCleaner.gl American Heart Association (AHA): heart.org Academy of Nutrition and Dietetics: eatright.org National Kidney Foundation (NKF): kidney.org This information is not intended to replace advice given to you by your health care provider. Make sure you discuss any questions you have with your health care provider. Document Revised: 12/09/2022 Document Reviewed: 12/09/2022 Elsevier Patient Education  2024 Else[employer] Inc. Managing Your Hypertension Hypertension, also called high blood pressure, is when the force of the blood pressing against the walls of the arteries is too strong. Arteries are blood vessels that carry blood from your heart throughout your body. Hypertension forces the heart to work harder to pump blood and may cause the arteries to become narrow or stiff. Understanding blood pressure readings A blood pressure reading includes a higher number over a lower number: The first, or top, number is called the systolic pressure. It is a measure of the pressure in your arteries as your heart beats. The second, or bottom number, is called the diastolic pressure. It is a measure of the pressure in your arteries as the heart relaxes. For most people, a normal blood pressure is below 120/80. Your personal target blood pressure may vary depending on your medical conditions, your age, and other factors. Blood pressure is classified into four stages. Based on your blood pressure reading, your  health care provider may use the following stages to determine what type of treatment you need, if any. Systolic pressure and diastolic pressure are measured in a unit called millimeters of mercury (mmHg). Normal Systolic pressure: below 120. Diastolic pressure: below 80. Elevated Systolic pressure: 120-129. Diastolic pressure: below 80. Hypertension stage 1 Systolic pressure: 130-139. Diastolic pressure: 80-89. Hypertension stage 2 Systolic pressure: 140 or above. Diastolic pressure: 90 or above. How can this condition affect me? Managing your hypertension is very important. Over time, hypertension can damage the arteries and decrease blood flow to parts of the body, including the brain, heart, and kidneys. Having untreated or uncontrolled hypertension can lead to: A heart attack. A stroke. A weakened blood vessel (aneurysm). Heart failure. Kidney damage. Eye damage. Memory and concentration problems. Vascular dementia. What actions can I take to manage this condition? Hypertension can be managed by  making lifestyle changes and possibly by taking medicines. Your health care provider will help you make a plan to bring your blood pressure within a normal range. You may be referred for counseling on a healthy diet and physical activity. Nutrition  Eat a diet that is high in fiber and potassium, and low in salt (sodium), added sugar, and fat. An example eating plan is called the DASH diet. DASH stands for Dietary Approaches to Stop Hypertension. To eat this way: Eat plenty of fresh fruits and vegetables. Try to fill one-half of your plate at each meal with fruits and vegetables. Eat whole grains, such as whole-wheat pasta, brown rice, or whole-grain bread. Fill about one-fourth of your plate with whole grains. Eat low-fat dairy products. Avoid fatty cuts of meat, processed or cured meats, and poultry with skin. Fill about one-fourth of your plate with lean proteins such as fish, chicken  without skin, beans, eggs, and tofu. Avoid pre-made and processed foods. These tend to be higher in sodium, added sugar, and fat. Reduce your daily sodium intake. Many people with hypertension should eat less than 1,500 mg of sodium a day. Lifestyle  Work with your health care provider to maintain a healthy body weight or to lose weight. Ask what an ideal weight is for you. Get at least 30 minutes of exercise that causes your heart to beat faster (aerobic exercise) most days of the week. Activities may include walking, swimming, or biking. Include exercise to strengthen your muscles (resistance exercise), such as weight lifting, as part of your weekly exercise routine. Try to do these types of exercises for 30 minutes at least 3 days a week. Do not use any products that contain nicotine or tobacco. These products include cigarettes, chewing tobacco, and vaping devices, such as e-cigarettes. If you need help quitting, ask your health care provider. Control any long-term (chronic) conditions you have, such as high cholesterol or diabetes. Identify your sources of stress and find ways to manage stress. This may include meditation, deep breathing, or making time for fun activities. Alcohol use Do not drink alcohol if: Your health care provider tells you not to drink. You are pregnant, may be pregnant, or are planning to become pregnant. If you drink alcohol: Limit how much you have to: 0-1 drink a day for women. 0-2 drinks a day for men. Know how much alcohol is in your drink. In the [place] one drink equals one 12 oz bottle of beer (355 mL), one 5 oz glass of wine (148 mL), or one 1 oz glass of hard liquor (44 mL). Medicines Your health care provider may prescribe medicine if lifestyle changes are not enough to get your blood pressure under control and if: Your systolic blood pressure is 130 or higher. Your diastolic blood pressure is 80 or higher. Take medicines only as told by your health care  provider. Follow the directions carefully. Blood pressure medicines must be taken as told by your health care provider. The medicine does not work as well when you skip doses. Skipping doses also puts you at risk for problems. Monitoring Before you monitor your blood pressure: Do not smoke, drink caffeinated beverages, or exercise within 30 minutes before taking a measurement. Use the bathroom and empty your bladder (urinate). Sit quietly for at least 5 minutes before taking measurements. Monitor your blood pressure at home as told by your health care provider. To do this: Sit with your back straight and supported. Place your feet flat on the floor.  Do not cross your legs. Support your arm on a flat surface, such as a table. Make sure your upper arm is at heart level. Each time you measure, take two or three readings one minute apart and record the results. You may also need to have your blood pressure checked regularly by your health care provider. General information Talk with your health care provider about your diet, exercise habits, and other lifestyle factors that may be contributing to hypertension. Review all the medicines you take with your health care provider because there may be side effects or interactions. Keep all follow-up visits. Your health care provider can help you create and adjust your plan for managing your high blood pressure. Where to find more information National Heart, Lung, and Blood Institute: PopSteam.is American Heart Association: www.heart.org Contact a health care provider if: You think you are having a reaction to medicines you have taken. You have repeated (recurrent) headaches. You feel dizzy. You have swelling in your ankles. You have trouble with your vision. Get help right away if: You develop a severe headache or confusion. You have unusual weakness or numbness, or you feel faint. You have severe pain in your chest or abdomen. You vomit  repeatedly. You have trouble breathing. These symptoms may be an emergency. Get help right away. Call 911. Do not wait to see if the symptoms will go away. Do not drive yourself to the hospital. Summary Hypertension is when the force of blood pumping through your arteries is too strong. If this condition is not controlled, it may put you at risk for serious complications. Your personal target blood pressure may vary depending on your medical conditions, your age, and other factors. For most people, a normal blood pressure is less than 120/80. Hypertension is managed by lifestyle changes, medicines, or both. Lifestyle changes to help manage hypertension include losing weight, eating a healthy, low-sodium diet, exercising more, stopping smoking, and limiting alcohol. This information is not intended to replace advice given to you by your health care provider. Make sure you discuss any questions you have with your health care provider. Document Revised: 08/06/2021 Document Reviewed: 08/06/2021 Elsevier Patient Education  2024 [employer].

## 2023-09-16 DIAGNOSIS — R079 Chest pain, unspecified: Secondary | ICD-10-CM | POA: Diagnosis not present

## 2023-09-16 DIAGNOSIS — R002 Palpitations: Secondary | ICD-10-CM

## 2023-09-17 ENCOUNTER — Encounter: Payer: Self-pay | Admitting: Family Medicine

## 2023-09-18 ENCOUNTER — Encounter: Payer: Self-pay | Admitting: Registered Nurse

## 2023-09-18 ENCOUNTER — Telehealth: Payer: Self-pay | Admitting: Registered Nurse

## 2023-09-18 DIAGNOSIS — I10 Essential (primary) hypertension: Secondary | ICD-10-CM

## 2023-09-18 NOTE — Telephone Encounter (Signed)
Patient reported she has been keeping BP log for PCM over the weekend and sometimes better and other times high after caffeine/stressors.  Patient stated tolerating medication well.  Zio patch PCM ordered for her applied/wearing and denied further needs/concerns at this time.  Discussed with patient medication has 13 hour half life so if taking at 9pm half of the drug removed from her system/wearing off at 9am.  PCM will look at her BP log to see if dose needs to be adjusted based on blood pressure averages.  Patient denied chest pain, stated she used relaxation breathing and meditation app to help calm down after stressors and elevated blood pressure.  Had 16 oz of coffee 1 hour prior to elevated BP reading.  Discussed with patient caffeine and stressors can increase blood pressure also.  Use relaxation techniques that have worked for her in the past.  No RN in clinic tomorrow. I will be onsite Tuesday 9-12p.  RN Chantal in clinic 8a-5p Thursday this week.  Clinic closed Wednesday.  Patient A&Ox3 spoke full sentences without difficulty denied needs or concerns at this time.  Discussed teledoc available 24/7 along with 988 national hotline if she needs to talk with someone regarding stressors.  Stated understood and does not need anyone at this time.  Patient verbalized understanding information and had no further questions at this time and plans to follow up with PCM.  Duration of call 5 minutes.

## 2023-09-21 NOTE — Telephone Encounter (Signed)
Patient seen in [facility] stated feeling okay.  Blood pressure improving on home checks again 160s/80s.  Tolerating medication without difficulty.  A&Ox3 spoke full sentences without difficulty respirations even and unlabored skin warm dry and pink gait sure and steady.  Patient has follow up scheduled with PCM and continuing home blood pressure log denied questions or concerns today.

## 2023-09-27 ENCOUNTER — Ambulatory Visit: Payer: MEDICAID | Admitting: Registered Nurse

## 2023-09-27 ENCOUNTER — Ambulatory Visit (INDEPENDENT_AMBULATORY_CARE_PROVIDER_SITE_OTHER): Payer: No Typology Code available for payment source | Admitting: Family Medicine

## 2023-09-27 ENCOUNTER — Encounter: Payer: Self-pay | Admitting: Registered Nurse

## 2023-09-27 VITALS — BP 152/82 | HR 65 | Temp 98.5°F | Wt 228.6 lb

## 2023-09-27 VITALS — BP 172/96 | HR 54 | Temp 97.2°F | Resp 16

## 2023-09-27 DIAGNOSIS — R42 Dizziness and giddiness: Secondary | ICD-10-CM

## 2023-09-27 DIAGNOSIS — H6123 Impacted cerumen, bilateral: Secondary | ICD-10-CM

## 2023-09-27 DIAGNOSIS — R748 Abnormal levels of other serum enzymes: Secondary | ICD-10-CM

## 2023-09-27 DIAGNOSIS — F419 Anxiety disorder, unspecified: Secondary | ICD-10-CM

## 2023-09-27 DIAGNOSIS — F32A Depression, unspecified: Secondary | ICD-10-CM | POA: Diagnosis not present

## 2023-09-27 DIAGNOSIS — H6991 Unspecified Eustachian tube disorder, right ear: Secondary | ICD-10-CM

## 2023-09-27 DIAGNOSIS — I10 Essential (primary) hypertension: Secondary | ICD-10-CM

## 2023-09-27 MED ORDER — HYDROXYZINE PAMOATE 25 MG PO CAPS: 25.0000 mg | ORAL_CAPSULE | Freq: Three times a day (TID) | ORAL | 0 refills | Status: AC | PRN

## 2023-09-27 MED ORDER — OLMESARTAN MEDOXOMIL 40 MG PO TABS: 40.0000 mg | ORAL_TABLET | Freq: Every day | ORAL | 0 refills | Status: AC

## 2023-09-27 NOTE — Patient Instructions (Addendum)
Managing Your Hypertension Hypertension, also called high blood pressure, is when the force of the blood pressing against the walls of the arteries is too strong. Arteries are blood vessels that carry blood from your heart throughout your body. Hypertension forces the heart to work harder to pump blood and may cause the arteries to become narrow or stiff. Understanding blood pressure readings A blood pressure reading includes a higher number over a lower number: The first, or top, number is called the systolic pressure. It is a measure of the pressure in your arteries as your heart beats. The second, or bottom number, is called the diastolic pressure. It is a measure of the pressure in your arteries as the heart relaxes. For most people, a normal blood pressure is below 120/80. Your personal target blood pressure may vary depending on your medical conditions, your age, and other factors. Blood pressure is classified into four stages. Based on your blood pressure reading, your health care provider may use the following stages to determine what type of treatment you need, if any. Systolic pressure and diastolic pressure are measured in a unit called millimeters of mercury (mmHg). Normal Systolic pressure: below 120. Diastolic pressure: below 80. Elevated Systolic pressure: 120-129. Diastolic pressure: below 80. Hypertension stage 1 Systolic pressure: 130-139. Diastolic pressure: 80-89. Hypertension stage 2 Systolic pressure: 140 or above. Diastolic pressure: 90 or above. How can this condition affect me? Managing your hypertension is very important. Over time, hypertension can damage the arteries and decrease blood flow to parts of the body, including the brain, heart, and kidneys. Having untreated or uncontrolled hypertension can lead to: A heart attack. A stroke. A weakened blood vessel (aneurysm). Heart failure. Kidney damage. Eye damage. Memory and concentration problems. Vascular  dementia. What actions can I take to manage this condition? Hypertension can be managed by making lifestyle changes and possibly by taking medicines. Your health care provider will help you make a plan to bring your blood pressure within a normal range. You may be referred for counseling on a healthy diet and physical activity. Nutrition  Eat a diet that is high in fiber and potassium, and low in salt (sodium), added sugar, and fat. An example eating plan is called the DASH diet. DASH stands for Dietary Approaches to Stop Hypertension. To eat this way: Eat plenty of fresh fruits and vegetables. Try to fill one-half of your plate at each meal with fruits and vegetables. Eat whole grains, such as whole-wheat pasta, brown rice, or whole-grain bread. Fill about one-fourth of your plate with whole grains. Eat low-fat dairy products. Avoid fatty cuts of meat, processed or cured meats, and poultry with skin. Fill about one-fourth of your plate with lean proteins such as fish, chicken without skin, beans, eggs, and tofu. Avoid pre-made and processed foods. These tend to be higher in sodium, added sugar, and fat. Reduce your daily sodium intake. Many people with hypertension should eat less than 1,500 mg of sodium a day. Lifestyle  Work with your health care provider to maintain a healthy body weight or to lose weight. Ask what an ideal weight is for you. Get at least 30 minutes of exercise that causes your heart to beat faster (aerobic exercise) most days of the week. Activities may include walking, swimming, or biking. Include exercise to strengthen your muscles (resistance exercise), such as weight lifting, as part of your weekly exercise routine. Try to do these types of exercises for 30 minutes at least 3 days a week. Do  not use any products that contain nicotine or tobacco. These products include cigarettes, chewing tobacco, and vaping devices, such as e-cigarettes. If you need help quitting, ask your  health care provider. Control any long-term (chronic) conditions you have, such as high cholesterol or diabetes. Identify your sources of stress and find ways to manage stress. This may include meditation, deep breathing, or making time for fun activities. Alcohol use Do not drink alcohol if: Your health care provider tells you not to drink. You are pregnant, may be pregnant, or are planning to become pregnant. If you drink alcohol: Limit how much you have to: 0-1 drink a day for women. 0-2 drinks a day for men. Know how much alcohol is in your drink. In the [place] one drink equals one 12 oz bottle of beer (355 mL), one 5 oz glass of wine (148 mL), or one 1 oz glass of hard liquor (44 mL). Medicines Your health care provider may prescribe medicine if lifestyle changes are not enough to get your blood pressure under control and if: Your systolic blood pressure is 130 or higher. Your diastolic blood pressure is 80 or higher. Take medicines only as told by your health care provider. Follow the directions carefully. Blood pressure medicines must be taken as told by your health care provider. The medicine does not work as well when you skip doses. Skipping doses also puts you at risk for problems. Monitoring Before you monitor your blood pressure: Do not smoke, drink caffeinated beverages, or exercise within 30 minutes before taking a measurement. Use the bathroom and empty your bladder (urinate). Sit quietly for at least 5 minutes before taking measurements. Monitor your blood pressure at home as told by your health care provider. To do this: Sit with your back straight and supported. Place your feet flat on the floor. Do not cross your legs. Support your arm on a flat surface, such as a table. Make sure your upper arm is at heart level. Each time you measure, take two or three readings one minute apart and record the results. You may also need to have your blood pressure checked regularly by  your health care provider. General information Talk with your health care provider about your diet, exercise habits, and other lifestyle factors that may be contributing to hypertension. Review all the medicines you take with your health care provider because there may be side effects or interactions. Keep all follow-up visits. Your health care provider can help you create and adjust your plan for managing your high blood pressure. Where to find more information National Heart, Lung, and Blood Institute: [employer]is American Heart Association: www.heart.org Contact a health care provider if: You think you are having a reaction to medicines you have taken. You have repeated (recurrent) headaches. You feel dizzy. You have swelling in your ankles. You have trouble with your vision. Get help right away if: You develop a severe headache or confusion. You have unusual weakness or numbness, or you feel faint. You have severe pain in your chest or abdomen. You vomit repeatedly. You have trouble breathing. These symptoms may be an emergency. Get help right away. Call 911. Do not wait to see if the symptoms will go away. Do not drive yourself to the hospital. Summary Hypertension is when the force of blood pumping through your arteries is too strong. If this condition is not controlled, it may put you at risk for serious complications. Your personal target blood pressure may vary depending on your medical conditions,  your age, and other factors. For most people, a normal blood pressure is less than 120/80. Hypertension is managed by lifestyle changes, medicines, or both. Lifestyle changes to help manage hypertension include losing weight, eating a healthy, low-sodium diet, exercising more, stopping smoking, and limiting alcohol. This information is not intended to replace advice given to you by your health care provider. Make sure you discuss any questions you have with your health care  provider. Document Revised: 08/06/2021 Document Reviewed: 08/06/2021 Elsevier Patient Education  2024 [employer] Dizziness Dizziness is a common problem. It makes you feel unsteady or light-headed. You may feel like you're about to faint. Dizziness can lead to getting hurt if you stumble or fall. It's more common to feel dizzy if you're an older adult. Many things can cause you to feel dizzy. These include: Medicines. Dehydration. This is when there's not enough water in your body. Illness. Follow these instructions at home: Eating and drinking  Drink enough fluid to keep your pee (urine) pale yellow. This helps keep you from getting dehydrated. Try to drink more clear fluids, such as water. Do not drink alcohol. Try to limit how much caffeine you take in. Try to limit how much salt, also called sodium, you take in. Activity Try not to make quick movements. Stand up slowly from sitting in a chair. Steady yourself until you feel okay. In the morning, first sit up on the side of the bed. When you feel okay, hold onto something and slowly stand up. Do this until you know that your balance is okay. If you need to stand in one place for a long time, move your legs often. Tighten and relax the muscles in your legs while you're standing. Do not drive or use machines if you feel dizzy. Avoid bending down if you feel dizzy. Place items in your home so you can reach them without leaning over. Lifestyle Do not smoke, vape, or use products with nicotine or tobacco in them. If you need help quitting, talk with your health care provider. Try to lower your stress level. You can do this by using methods like yoga or meditation. Talk with your provider if you need help. General instructions Watch your dizziness for any changes. Take your medicines only as told by your provider. Talk with your provider if you think you're dizzy because of a medicine you're taking. Tell a friend or a family member  that you're feeling dizzy. If they spot any changes in your behavior, have them call your provider. Contact a health care provider if: Your dizziness doesn't go away, or you have new symptoms. Your dizziness gets worse. You feel like you may vomit. You have trouble hearing. You have a fever. You have neck pain or a stiff neck. You fall or get hurt. Get help right away if: You vomit each time you eat or drink. You have watery poop and can't eat or drink. You have trouble talking, walking, swallowing, or using your arms, hands, or legs. You feel very weak. You're bleeding. You're not thinking clearly, or you have trouble forming sentences. A friend or family member may spot this. Your vision changes, or you get a very bad headache. These symptoms may be an emergency. Call 911 right away. Do not wait to see if the symptoms will go away. Do not drive yourself to the hospital. This information is not intended to replace advice given to you by your health care provider. Make sure you discuss any questions you  have with your health care provider. Document Revised: 01/06/2023 Document Reviewed: 01/06/2023 Elsevier Patient Education  2024 [employer] Palpitations Palpitations are feelings that your heartbeat is irregular or is faster than normal. It may feel like your heart is fluttering or skipping a beat. Palpitations may be caused by many things, including smoking, caffeine, alcohol, stress, and certain medicines or drugs. Most causes of palpitations are not serious.  However, some palpitations can be a sign of a serious problem. Further tests and a thorough medical history will be done to find the cause of your palpitations. Your provider may order tests such as an ECG, labs, an echocardiogram, or an ambulatory continuous ECG monitor. Follow these instructions at home: Pay attention to any changes in your symptoms. Let your health care provider know about them. Take these actions to help  manage your symptoms: Eating and drinking Follow instructions from your health care provider about eating or drinking restrictions. You may need to avoid foods and drinks that may cause palpitations. These may include: Caffeinated coffee, tea, soft drinks, and energy drinks. Chocolate. Alcohol. Diet pills. Lifestyle     Take steps to reduce your stress and anxiety. Things that can help you relax include: Yoga. Mind-body activities, such as deep breathing, meditation, or using words and images to create positive thoughts (guided imagery). Physical activity, such as swimming, jogging, or walking. Tell your health care provider if your palpitations increase with activity. If you have chest pain or shortness of breath with activity, do not continue the activity until you are seen by your health care provider. Biofeedback. This is a method that helps you learn to use your mind to control things in your body, such as your heartbeat. Get plenty of rest and sleep. Keep a regular bed time. Do not use drugs, including cocaine or ecstasy. Do not use marijuana. Do not use any products that contain nicotine or tobacco. These products include cigarettes, chewing tobacco, and vaping devices, such as e-cigarettes. If you need help quitting, ask your health care provider. General instructions Take over-the-counter and prescription medicines only as told by your health care provider. Keep all follow-up visits. This is important. These may include visits for further testing if palpitations do not go away or get worse. Contact a health care provider if: You continue to have a fast or irregular heartbeat for a long period of time. You notice that your palpitations occur more often. Get help right away if: You have chest pain or shortness of breath. You have a severe headache. You feel dizzy or you faint. These symptoms may represent a serious problem that is an emergency. Do not wait to see if the symptoms  will go away. Get medical help right away. Call your local emergency services (911 in the [place] Do not drive yourself to the hospital. Summary Palpitations are feelings that your heartbeat is irregular or is faster than normal. It may feel like your heart is fluttering or skipping a beat. Palpitations may be caused by many things, including smoking, caffeine, alcohol, stress, certain medicines, and drugs. Further tests and a thorough medical history may be done to find the cause of your palpitations. Get help right away if you faint or have chest pain, shortness of breath, severe headache, or dizziness. This information is not intended to replace advice given to you by your health care provider. Make sure you discuss any questions you have with your health care provider. Document Revised: 04/15/2021 Document Reviewed: 04/15/2021 Elsevier Patient Education  2024 [employer].Eustachian Tube Dysfunction  Eustachian tube dysfunction refers to a condition in which a blockage develops in the narrow passage that connects the middle ear to the back of the nose (eustachian tube). The eustachian tube regulates air pressure in the middle ear by letting air move between the ear and nose. It also helps to drain fluid from the middle ear space. Eustachian tube dysfunction can affect one or both ears. When the eustachian tube does not function properly, air pressure, fluid, or both can build up in the middle ear. What are the causes? This condition occurs when the eustachian tube becomes blocked or cannot open normally. Common causes of this condition include: Ear infections. Colds and other infections that affect the nose, mouth, and throat (upper respiratory tract). Allergies. Irritation from cigarette smoke. Irritation from stomach acid coming up into the esophagus (gastroesophageal reflux). The esophagus is the part of the body that moves food from the mouth to the stomach. Sudden changes in air pressure,  such as from descending in an airplane or scuba diving. Abnormal growths in the nose or throat, such as: Growths that line the nose (nasal polyps). Abnormal growth of cells (tumors). Enlarged tissue at the back of the throat (adenoids). What increases the risk? You are more likely to develop this condition if: You smoke. You are overweight. You are a child who has: Certain birth defects of the mouth, such as cleft palate. Large tonsils or adenoids. What are the signs or symptoms? Common symptoms of this condition include: A feeling of fullness in the ear. Ear pain. Clicking or popping noises in the ear. Ringing in the ear (tinnitus). Hearing loss. Loss of balance. Dizziness. Symptoms may get worse when the air pressure around you changes, such as when you travel to an area of high elevation, fly on an airplane, or go scuba diving. How is this diagnosed? This condition may be diagnosed based on: Your symptoms. A physical exam of your ears, nose, and throat. Tests, such as those that measure: The movement of your eardrum. Your hearing (audiometry). How is this treated? Treatment depends on the cause and severity of your condition. In mild cases, you may relieve your symptoms by moving air into your ears. This is called "popping the ears." In more severe cases, or if you have symptoms of fluid in your ears, treatment may include: Medicines to relieve congestion (decongestants). Medicines that treat allergies (antihistamines). Nasal sprays or ear drops that contain medicines that reduce swelling (steroids). A procedure to drain the fluid in your eardrum. In this procedure, a small tube may be placed in the eardrum to: Drain the fluid. Restore the air in the middle ear space. A procedure to insert a balloon device through the nose to inflate the opening of the eustachian tube (balloon dilation). Follow these instructions at home: Lifestyle Do not do any of the following until  your health care provider approves: Travel to high altitudes. Fly in airplanes. Work in a Estate agent or room. Scuba dive. Do not use any products that contain nicotine or tobacco. These products include cigarettes, chewing tobacco, and vaping devices, such as e-cigarettes. If you need help quitting, ask your health care provider. Keep your ears dry. Wear fitted earplugs during showering and bathing. Dry your ears completely after. General instructions Take over-the-counter and prescription medicines only as told by your health care provider. Use techniques to help pop your ears as recommended by your health care provider. These may include: Chewing gum. Yawning.  Frequent, forceful swallowing. Closing your mouth, holding your nose closed, and gently blowing as if you are trying to blow air out of your nose. Keep all follow-up visits. This is important. Contact a health care provider if: Your symptoms do not go away after treatment. Your symptoms come back after treatment. You are unable to pop your ears. You have: A fever. Pain in your ear. Pain in your head or neck. Fluid draining from your ear. Your hearing suddenly changes. You become very dizzy. You lose your balance. Get help right away if: You have a sudden, severe increase in any of your symptoms. Summary Eustachian tube dysfunction refers to a condition in which a blockage develops in the eustachian tube. It can be caused by ear infections, allergies, inhaled irritants, or abnormal growths in the nose or throat. Symptoms may include ear pain or fullness, hearing loss, or ringing in the ears. Mild cases are treated with techniques to unblock the ears, such as yawning or chewing gum. More severe cases are treated with medicines or procedures. This information is not intended to replace advice given to you by your health care provider. Make sure you discuss any questions you have with your health care provider. Document  Revised: 02/02/2021 Document Reviewed: 02/02/2021 Elsevier Patient Education  2024 [employer] Inc. Earwax Buildup, Adult Your ears make something called earwax. It helps keep germs called bacteria away and protects the skin in your ears. Sometimes, too much earwax can build up. This can cause discomfort or make it harder to hear. What are the causes? Earwax buildup can happen when you have too much earwax in your ears. Earwax is made in the outer part of your ear canal. It's supposed to fall out in small amounts over time. But if your ears aren't able to clean themselves like they should, earwax can build up. What increases the risk? You're more likely to get earwax buildup if: You clean your ears with cotton swabs. You pick at your ears. You use earplugs or in-ear headphones a lot. You wear hearing aids. You may also be more likely to get it if: You're female. You're older. Your ears naturally make more earwax. You have narrow ear canals or extra hair in your ears. Your earwax is too thick or sticky. You have eczema. You're dehydrated. This means there's not enough fluid in your body. What are the signs or symptoms? Symptoms of earwax buildup include: Not being able to hear as well. A feeling of fullness in your ear. Feeling like your ear is plugged. Fluid coming from your ear. Ear pain or an itchy ear. Ringing in your ear. Coughing or problems with balance. How is this diagnosed? Earwax buildup may be diagnosed based on your symptoms, medical history, and an ear exam. During the exam, your health care provider will look into your ear with a tool called an otoscope. You may also have tests, such as a hearing test. How is this treated? Earwax buildup may be treated by: Using ear drops. Having the earwax removed by a provider. The provider may: Flush the ear with water. Use a tool called a curette that has a loop on the end. Use a suction device. Having surgery. This may be done  in severe cases. Follow these instructions at home:  Cleaning your ears Clean your ears as told by your provider. You can clean the outside of your ears with a washcloth or tissue. Do not overclean your ears. Do not put anything into your ear  unless told. This includes cotton swabs. General instructions Take over-the-counter and prescription medicines only as told by your provider. Drink enough fluid to keep your pee (urine) pale yellow. This helps thin the earwax. If you have hearing aids, clean them as told. Keep all follow-up visits. If earwax builds up in your ears often or if you use hearing aids, ask your provider how often you should have your ears cleaned. Contact a health care provider if: Your ear pain gets worse. You have a fever. You have pus, blood, or other fluid coming from your ear. You have hearing loss. You have ringing in your ears that won't go away. You feel like the room is spinning. This is called vertigo. Your symptoms don't get better with treatment. This information is not intended to replace advice given to you by your health care provider. Make sure you discuss any questions you have with your health care provider. Document Revised: 02/03/2023 Document Reviewed: 02/03/2023 Elsevier Patient Education  2024 [employer].

## 2023-09-27 NOTE — Assessment & Plan Note (Signed)
Blood pressure is still elevated, not controlled. Recommend to increase Olmesartan to 40mg  daily from 20mg  daily. Recommend to finish 20mg  prescription by taking two 20mg  tablets to equal 40mg . Continue to monitor her blood pressure twice a day. Recommend to obtain an [employer] upper arm blood pressure cuff instead of wrist if at all possible. Provided patient a BP record sheet to record her readings.

## 2023-09-27 NOTE — Progress Notes (Signed)
Subjective:    Patient ID: Sonya Trujillo, female    DOB: [birth year], 47 y.o.   MRN: [id number]  46y/o established caucasian female here for evaluation dizziness and palpitations.  Palpitations have been ongoing for months.  Wearing zio patch and has appt with PCM this afternoon to follow up starting new blood pressure medication 09/13/23. Stated blood pressure in clinic today similar to her home BP log.  Sometimes it is lower but highest is today's number typically. Her stress levels go up when she has dizziness at work and worries about the irregular heart beat.  Stated it comes and goes.  Denied alcohol intake.  Had caffeine this am didn't worsen symptoms.  Ate breakfast.  Denied sick contacts or illness.  Has been using debrox ear drops to soften wax in bilateral ears.  Hearing still muffled and ear ringing still occurring.  Denied ear discharge/fever/chills/n/v/d.  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  Supervisor walked patient to clinic today and asked for patient evaluation stating employee was not feeling well/dizzy.  Patient reported PCM appt 1430 today scheduled.  Patient concerned about missing any more work time as she only has 6 hours of PTO remaining.  She had to miss work at the beginning of the month due to blood pressure was even more elevated than today.      Review of Systems  Constitutional:  Negative for chills and fever.  HENT:  Positive for tinnitus. Negative for nosebleeds, trouble swallowing and voice change.   Eyes:  Positive for visual disturbance. Negative for photophobia, pain, discharge, redness and itching.       Patient reports still having floaters in vision typically all the time but seem a little worse this month  Respiratory:  Negative for cough, choking, wheezing and stridor.   Cardiovascular:  Positive for palpitations. Negative  for leg swelling.  Gastrointestinal:  Negative for abdominal pain, diarrhea and vomiting.  Genitourinary:  Negative for difficulty urinating.  Musculoskeletal:  Negative for back pain, gait problem, neck pain and neck stiffness.  Allergic/Immunologic: Positive for environmental allergies.  Neurological:  Positive for dizziness and headaches. Negative for tremors, seizures, syncope, facial asymmetry, speech difficulty and numbness.  Hematological:  Does not bruise/bleed easily.  Psychiatric/Behavioral:  Negative for agitation, confusion and sleep disturbance. The patient is nervous/anxious.        Objective:   Physical Exam Vitals and nursing note reviewed.  Constitutional:      General: She is awake. She is not in acute distress.    Appearance: Normal appearance. She is well-developed and well-groomed. She is obese. She is not ill-appearing, toxic-appearing or diaphoretic.  HENT:     Head: Normocephalic and atraumatic.     Jaw: There is normal jaw occlusion.     Salivary Glands: Right salivary gland is not diffusely enlarged or tender. Left salivary gland is not diffusely enlarged or tender.     Right Ear: Hearing, ear canal and external ear normal. No decreased hearing noted. No laceration, drainage, swelling or tenderness. A middle ear effusion is present. There is impacted cerumen. No foreign body. No mastoid tenderness. No PE tube. No hemotympanum. Tympanic membrane is not injected, scarred, perforated, erythematous, retracted or bulging.     Left Ear: Hearing, ear canal and external ear normal. No decreased hearing noted. No laceration, drainage, swelling or tenderness.  No middle ear effusion. There is impacted cerumen. No foreign  body. No mastoid tenderness. No PE tube. No hemotympanum. Tympanic membrane is not injected, scarred, perforated, erythematous, retracted or bulging.     Ears:     Comments: Bilateral TMs obscured with gold cerumen; irrigation with room temperature water and  syringe 10ml patient tolerated well reported mild increase in dizzyness with left irrigation resolved after irrigation stopped.  Curretage used to remove cerumen bilateral canals by NP  Left TM still had cerumen covering TM after irrigation and curretage curemen removal discussed to continue debrox drops this week; right TM 90% cleared after procedure and 6 oclock canal drop of blood and erythema when cerumen had been adhered discussed with patient do not use cotton applicators in ears and hold debrox drops right ear x 2 days; hard wax chunk adjacent to tm at 6 oclock right needs further debrox treatment next week also    Nose: Congestion present. No rhinorrhea.     Right Turbinates: Not enlarged, swollen or pale.     Left Turbinates: Not enlarged, swollen or pale.     Right Sinus: No maxillary sinus tenderness or frontal sinus tenderness.     Left Sinus: No maxillary sinus tenderness or frontal sinus tenderness.     Mouth/Throat:     Lips: Pink. No lesions.     Mouth: Mucous membranes are moist. No oral lesions or angioedema.     Dentition: No gum lesions.     Tongue: No lesions. Tongue does not deviate from midline.     Palate: No mass and lesions.     Pharynx: Pharyngeal swelling, posterior oropharyngeal erythema and postnasal drip present. No oropharyngeal exudate or uvula swelling.     Tonsils: No tonsillar exudate or tonsillar abscesses.     Comments: Cobblestoning posterior pharynx; bilateral allergic shiners; nasal sniffing observed in clinic; right TM air fluid level clear Eyes:     General: Lids are normal. Vision grossly intact. Gaze aligned appropriately. Allergic shiner present. No scleral icterus.       Right eye: No discharge.        Left eye: No discharge.     Extraocular Movements: Extraocular movements intact.     Right eye: Normal extraocular motion and no nystagmus.     Left eye: Normal extraocular motion and no nystagmus.     Conjunctiva/sclera:     Right eye: Right  conjunctiva is injected. No chemosis, exudate or hemorrhage.    Left eye: Left conjunctiva is injected. No chemosis, exudate or hemorrhage.    Pupils: Pupils are equal, round, and reactive to light.     Comments: Bulbar conjunctiva 1+/4 injection and eyelid 2+/4 injection bilateral conjunctiva; 1+/4 nonpitting edema bilateral upper and lower eyelids  Neck:     Trachea: Trachea and phonation normal.  Cardiovascular:     Rate and Rhythm: Bradycardia present. Rhythm regularly irregular.     Pulses: Normal pulses.          Radial pulses are 2+ on the right side and 2+ on the left side.     Heart sounds: Normal heart sounds, S1 normal and S2 normal. Heart sounds not distant. No murmur heard.    No friction rub. No gallop.     Comments: Patient with double beat every 6th beat Pulmonary:     Effort: Pulmonary effort is normal. No respiratory distress.     Breath sounds: Normal breath sounds and air entry. No stridor or transmitted upper airway sounds. No wheezing, rhonchi or rales.     Comments: Spoke full  sentences without difficulty; no cough observed in exam room Chest:     Chest wall: No tenderness.  Abdominal:     General: Abdomen is flat. There is no distension.     Palpations: Abdomen is soft.  Musculoskeletal:        General: Normal range of motion.     Right hand: Normal strength. Normal capillary refill.     Left hand: Normal strength. Normal capillary refill.     Cervical back: Normal range of motion and neck supple. No swelling, edema, deformity, erythema, signs of trauma, lacerations, rigidity, spasms, torticollis, tenderness or crepitus. No pain with movement. Normal range of motion.     Thoracic back: No swelling, edema, deformity, signs of trauma, lacerations, spasms or tenderness. Normal range of motion.     Right lower leg: Edema present.     Left lower leg: Edema present.     Comments: Trace nonpitting edema ankles bilaterally  Lymphadenopathy:     Head:     Right side of  head: No submental, submandibular, tonsillar, preauricular, posterior auricular or occipital adenopathy.     Left side of head: No submental, submandibular, tonsillar, preauricular, posterior auricular or occipital adenopathy.     Cervical: No cervical adenopathy.     Right cervical: No superficial, deep or posterior cervical adenopathy.    Left cervical: No superficial, deep or posterior cervical adenopathy.  Skin:    General: Skin is warm and dry.     Capillary Refill: Capillary refill takes less than 2 seconds.     Coloration: Skin is not ashen, cyanotic, jaundiced, mottled, pale or sallow.     Findings: No abrasion, abscess, acne, bruising, burn, ecchymosis, erythema, signs of injury, laceration, lesion, petechiae, rash or wound.     Nails: There is no clubbing.  Neurological:     General: No focal deficit present.     Mental Status: She is alert and oriented to person, place, and time. Mental status is at baseline.     GCS: GCS eye subscore is 4. GCS verbal subscore is 5. GCS motor subscore is 6.     Cranial Nerves: Cranial nerves 2-12 are intact. No cranial nerve deficit, dysarthria or facial asymmetry.     Sensory: Sensation is intact.     Motor: Motor function is intact. No weakness, tremor, atrophy, abnormal muscle tone or seizure activity.     Coordination: Coordination is intact. Coordination normal.     Gait: Gait is intact. Gait normal.     Comments: In/out of chair and on/off exam table without difficulty; gait sure and steady in clinic; bilateral hand grasp equal 5/5  Psychiatric:        Attention and Perception: Attention and perception normal.        Mood and Affect: Mood is anxious. Affect is tearful.        Speech: Speech normal.        Behavior: Behavior normal. Behavior is cooperative.        Thought Content: Thought content normal.        Cognition and Memory: Cognition and memory normal.        Judgment: Judgment normal.     Comments: Patient worried regarding her  elevated blood pressure and irregular heart beat; teary at times in clinic; placed in relaxation room drank 16 oz water without difficulty listened to her phone to relax prior to returning to workcenter      Latest Reference Range & Units 03/05/21 11:01 07/19/23 11:45  Sodium 134 - 144 mmol/L 138 142  Potassium 3.5 - 5.2 mmol/L 4.3 4.4  Chloride 96 - 106 mmol/L 106 109 (H)  Glucose 70 - 99 mg/dL 161 (H) 88  BUN 6 - 24 mg/dL 9 14  Creatinine 0.96 - 1.00 mg/dL 0.45 4.09  Calcium 8.7 - 10.2 mg/dL 9.2 9.1  BUN/Creatinine Ratio 9 - 23  10 17   eGFR >59 mL/min/1.73 85 87  Phosphorus 3.0 - 4.3 mg/dL  3.4  Alkaline Phosphatase 44 - 121 IU/L 46 53  Albumin 3.9 - 4.9 g/dL 3.9 3.9  Albumin/Globulin Ratio 1.2 - 2.2  1.8   Uric Acid 2.6 - 6.2 mg/dL  3.9  AST 0 - 40 IU/L 15 60 (H)  ALT 0 - 32 IU/L  62 (H)  Total Protein 6.0 - 8.5 g/dL 6.1 6.5  Total Bilirubin 0.0 - 1.2 mg/dL 0.5 0.4  GGT 0 - 60 IU/L  11  Estimated CHD Risk 0.0 - 1.0 times avg.   < 0.5  LDH 119 - 226 IU/L  225  Total CHOL/HDL Ratio 0.0 - 4.4 ratio 4.4 3.2  Cholesterol, Total 100 - 199 mg/dL 811 914  HDL Cholesterol >39 mg/dL 40 47  Triglycerides 0 - 149 mg/dL 72 71  VLDL Cholesterol Cal 5 - 40 mg/dL 14 14  LDL Chol Calc (NIH) 0 - 99 mg/dL 782 (H) 88  Iron 27 - 956 ug/dL  213  Vitamin D, 08-MVHQION 30.0 - 100.0 ng/mL 11.7 (L)   Globulin, Total 1.5 - 4.5 g/dL 2.2 2.6  WBC 3.4 - 62.9 x10E3/uL 6.5 6.7  RBC 3.77 - 5.28 x10E6/uL 4.90 4.25  Hemoglobin 11.1 - 15.9 g/dL 52.8 41.3  HCT 24.4 - 01.0 % 44.7 39.0  MCV 79 - 97 fL 91 92  MCH 26.6 - 33.0 pg 30.8 31.3  MCHC 31.5 - 35.7 g/dL 27.2 53.6  RDW 64.4 - 03.4 % 12.4 12.3  Platelets 150 - 450 x10E3/uL 242 224  Neutrophils Not Estab. % 59 45  Immature Granulocytes Not Estab. % 0 0  NEUT# 1.4 - 7.0 x10E3/uL 3.8 3.0  Lymphs Abs 0.7 - 3.1 x10E3/uL 1.9 2.8  Monocytes Absolute 0.1 - 0.9 x10E3/uL 0.6 0.6  Basophils Absolute 0.0 - 0.2 x10E3/uL 0.1 0.1  Immature Grans (Abs) 0.0 - 0.1  x10E3/uL 0.0 0.0  Lymphs Not Estab. % 29 41  Monocytes Not Estab. % 8 10  Basos Not Estab. % 1 1  Eos Not Estab. % 3 3  EOS (ABSOLUTE) 0.0 - 0.4 x10E3/uL 0.2 0.2  Hemoglobin A1C 4.8 - 5.6 %  5.6  TSH 0.450 - 4.500 uIU/mL 1.060 1.340  Thyroxine (T4) 4.5 - 12.0 ug/dL  6.5  Free Thyroxine Index 1.2 - 4.9   1.8  T3 Uptake Ratio 24 - 39 %  28  HCV Ab 0.0 - 0.9 s/co ratio <0.1   HCV Interp 1:  Comment   HIV Screen 4th Generation wRfx Non Reactive  Non Reactive   (H): Data is abnormally high (L): Data is abnormally low Patient went back to workstation at 1000 ambulatory  Patient seen at 1330 stated still feeling about the same getting ready to leave in 30 minutes for PCM appt.  Denied worsening symptoms palpitations unchanged, skipped lunch, dizziness intermittent not worsening.  Denied new symptoms. 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 Skin warm dry and pink respirations even and unlabored.  Denied headache/chest pain/dyspnea/vomiting.  Denied further questions at this time.    Assessment & Plan:  A-hypertension essential malignant, dizziness, cerumen impaction bilateral, eustachian tube dysfunction right  P-Keep appt with PCM discussed most likely will have medication adjustment for hypertension this afternoon.  Continue zio patch.  No EKG available in this clinic.  Regular palpitation rhythm on exam today usually a double beat during inspiration then regular rate between occurring every 6 beats.  Denied chest pain.  Denied new palpitations "ongoing for previous months" only recently saw PCM and has 2 week zio patch on from PCM.  Discussed avoid further caffeine today as can worsen palpitations and blood pressure. Avoid alcohol intake.   Avoid added salt in diet greater than 2000mg  per day  Consider dash diet.  Denied chest pain at this time.  Having anxiety at work due to worrying about missing time at work due to blood pressure skipping lunch today to go to her doctor's appt.   Encouraged patient to schedule to obtain 7-8 hours sleep per night.  Avoid skipping meals.  Avoid more than 1 serving 8 oz caffeine per day at this time due to palpitations and elevated blood pressure.  Encouraged water intake to maintain hydration.  Consider teledoc/EAP appt due to stressors (health and work)  Discussed with patient free no copays required for these visits.  Discussed may call national 68 line also for free if prefers.  Dizziness intermittent discussed may be related to cerumen impaction/fluid in middle ears/elevated blood pressure.  Keep appt with PCM this afternoon for adjustment of hypertension medication as not yet at goal  Recommended today to go home rest prior to appt but patient stated she could not miss any more time from work and supervisor working with her regarding duties not extra strenuous.  Exitcare handouts on dizziness and hypertension management.  Reminder to use her meditation app helps her to relax/music.  Patient verbalized understanding information/instructions and had no further questions at that time.   Patient reported slight discomfort external ear canal after procedure curretage extraction right ear, minor redness right canal 6 oclock and TM intact right with scant cerumen remaining adjacent to TM and left still with 90% TM covered with cerumen after irrigation and curettage. Patient reported sounds are louder now right ear and still feels like some water in ear.  Discussed restart debrox drops left ear after work today and right ear restart this weekend due to canal irritation/abrasion after cerumen removal.  When in clinic have provider check ears to see if buildup cleared or if needs another ear irrigation/curretage round.  Discussed purpose of earwax with patient.  Avoid cotton applicator (Q-tip) use in ears.  exitcare handout on cerumen impaction  Patient verbalized understanding, agreed with plan of care and had no further questions at this time.     No  evidence of invasive bacterial infection, non toxic and well hydrated.  I do not see where any further testing or imaging is necessary at this time.   I will suggest supportive care, rest, good hygiene and encourage the patient to take adequate fluids.  The patient is to return to clinic or EMERGENCY ROOM if symptoms worsen or change significantly e.g. ear pain, fever, purulent discharge from ears or bleeding.  Exitcare handout on eustachian tube dysfunction.  Discussed with patient post nasal drip irritates throat/causes swelling blocks eustachian tubes from draining and fluid fills up middle ear.  Bacteria/viruses can grow in fluid and with moving head tube compressed and increases pressure in tube/ear worsening pain.  Studies show will take 30 days for fluid to resolve  after post nasal drip controlled with nasal steroid/antihistamine. Antibiotics and steroids do not speed up fluid removal.  Discussed wax pressing on TM can also cause dizziness/dysfunction of otoliths.  Patient verbalized agreement and understanding of treatment plan and had no further questions at this time.

## 2023-09-27 NOTE — Patient Instructions (Addendum)
-  Blood pressure is still elevated. Recommend to increase Olmesartan to 40mg  daily from 20mg  daily. Recommend to finish 20mg  prescription by taking two 20mg  tablets to equal 40mg . Continue to monitor your blood pressure twice a day. Recommend to obtain an Omron upper arm blood pressure cuff instead of wrist.  -Referral placed to psychology (counseling) and psychiatry for medication management of anxiety and depression. Please call the office or send a MyChart message in 2 weeks, if you do not hear back about an appointment.  -Ordered labs. Office will call with lab results.  -Prescribed Hydroxyzine 25mg  tablet, may take 1 tablet every 8 hours as needed for anxiety.  -If your symptoms change or become worse, please go to the emergency department.

## 2023-09-27 NOTE — Progress Notes (Signed)
Established Patient Office Visit   Subjective:  Patient ID: Sonya Trujillo, female    DOB: 04-24-1976  Age: 47 y.o. MRN: [id number]  Chief Complaint  Patient presents with   Medical Management of Chronic Issues    Had to leave work earlier thatn expected due to dizziness, heart palpitations, and SOB. PA at work listed to palpitations, pt thought she was having an anxiety attack.     HPI HTN: New onset. On previous visit, started Olmesartan 20mg  daily and was recommended to monitor her blood pressure BID for 2 weeks. Patient brought in her BP readings, ranging 125-181/53-105 with using a wrist blood pressure cuff. She reports she started medication on 10/10 after visit on 10/08. She reports she is experiencing intermittent dizziness and SHOB. Patient reports she is experiencing palpitations consistently. She is currently wearing a Zio heart monitor for her palpitations. Also, has a cardiology appointment scheduled on 11/27. She does not think her symptoms are related to starting the medication, more related to anxiety. She was seen earlier in the day by [facility] with Albina Billet, NP for bilateral impacted cerumen, dizziness, and HTN.   Patient reports her anxiety has increased since previous visit. She reports she has had an ex boyfriend that has been more on social media and some people has come to patient to discuss about this. She reports besides that, she enjoys her job, has a stable partner, and child.  ROS See HPI above     Objective:   BP (!) 152/82 (BP Location: Left Arm, Patient Position: Sitting, Cuff Size: Large)   Pulse 65   Temp 98.5 F (36.9 C) (Oral)   Wt 228 lb 9.6 oz (103.7 kg)   LMP 09/11/2023 (Exact Date)   SpO2 97%   BMI 36.90 kg/m    Physical Exam Vitals reviewed.  Constitutional:      General: She is not in acute distress.    Appearance: Normal appearance. She is not ill-appearing, toxic-appearing or diaphoretic.   Eyes:     General:        Right eye: No discharge.        Left eye: No discharge.     Conjunctiva/sclera: Conjunctivae normal.  Cardiovascular:     Rate and Rhythm: Normal rate and regular rhythm.     Heart sounds: Normal heart sounds. No murmur heard.    No friction rub. No gallop.     Comments: Heart monitor is on anterior left chest Pulmonary:     Effort: Pulmonary effort is normal. No respiratory distress.     Breath sounds: Normal breath sounds.  Musculoskeletal:        General: Normal range of motion.  Skin:    General: Skin is warm and dry.  Neurological:     General: No focal deficit present.     Mental Status: She is alert and oriented to person, place, and time. Mental status is at baseline.  Psychiatric:        Mood and Affect: Mood normal.        Behavior: Behavior normal.        Thought Content: Thought content normal.        Judgment: Judgment normal.      Assessment & Plan:  Hypertension, unspecified type Assessment & Plan: Blood pressure is still elevated, not controlled. Recommend to increase Olmesartan to 40mg  daily from 20mg  daily. Recommend to finish 20mg  prescription by taking two 20mg  tablets to equal 40mg . Continue to  monitor her blood pressure twice a day. Recommend to obtain an Omron upper arm blood pressure cuff instead of wrist if at all possible. Provided patient a BP record sheet to record her readings.   Orders: -     Comprehensive metabolic panel -     Olmesartan Medoxomil; Take 1 tablet (40 mg total) by mouth daily.  Dispense: 90 tablet; Refill: 0  Elevated liver enzymes -     Comprehensive metabolic panel  Anxiety and depression -     Ambulatory referral to Psychology -     Ambulatory referral to Psychiatry -     hydrOXYzine Pamoate; Take 1 capsule (25 mg total) by mouth every 8 (eight) hours as needed for up to 15 days.  Dispense: 45 capsule; Refill: 0   1.Ordered CMP due to elevated AST and ALT from 08/13 labs and she recently started  Olmesartan.  2.Referral placed to psychology (counseling) and psychiatry for medication management of anxiety and depression. Advised to please call the office or send a MyChart message in 2 weeks, if she does not hear back about an appointment.  3. Prescribed Hydroxyzine 25mg  tablet, may take 1 tablet every 8 hours as needed for anxiety to help with possible panic attacks patient is experiencing.  4.Advised if her symptoms change or become worse, please go to the emergency department.  Return in about 2 weeks (around 10/11/2023) for follow-up.   Zandra Abts, NP

## 2023-09-28 ENCOUNTER — Telehealth: Payer: Self-pay | Admitting: Registered Nurse

## 2023-09-28 DIAGNOSIS — I10 Essential (primary) hypertension: Secondary | ICD-10-CM

## 2023-09-28 LAB — COMPREHENSIVE METABOLIC PANEL
ALT: 13 U/L (ref 0–35)
AST: 16 U/L (ref 0–37)
Albumin: 4.3 g/dL (ref 3.5–5.2)
Alkaline Phosphatase: 41 U/L (ref 39–117)
BUN: 11 mg/dL (ref 6–23)
CO2: 28 meq/L (ref 19–32)
Calcium: 9.4 mg/dL (ref 8.4–10.5)
Chloride: 102 meq/L (ref 96–112)
Creatinine, Ser: 0.79 mg/dL (ref 0.40–1.20)
GFR: 89.38 mL/min (ref >60.00)
Glucose, Bld: 89 mg/dL (ref 70–99)
Potassium: 3.8 meq/L (ref 3.5–5.1)
Sodium: 137 meq/L (ref 135–145)
Total Bilirubin: 0.6 mg/dL (ref 0.2–1.2)
Total Protein: 7.4 g/dL (ref 6.0–8.3)

## 2023-09-29 ENCOUNTER — Encounter: Payer: Self-pay | Admitting: Registered Nurse

## 2023-09-29 ENCOUNTER — Ambulatory Visit: Payer: MEDICAID

## 2023-09-29 DIAGNOSIS — Z23 Encounter for immunization: Secondary | ICD-10-CM

## 2023-09-29 NOTE — Telephone Encounter (Signed)
Patient seen in workcenter today saw [facility] 09/27/23 wasn't able to pick up new Rx until 09/28/23 stayed home yesterday until she had tried new dosing stated tolerated without difficulty home BP monitoring has showed improvement.  Feeling better today.  Getting vaccines at [facility] today covid fall booster.  A&Ox3 spoke full sentences without difficulty respirations even and unlabored RA Skin warm dry and pink not teary today gait sure and steady in warehouse.  Stated palpitations decreased after starting new dose hypertension medication also and feels calmer.  Denied questions or concerns.  Has follow up scheduled with PCM and continuing BP log.

## 2023-10-06 ENCOUNTER — Telehealth: Payer: Self-pay | Admitting: Family Medicine

## 2023-10-06 ENCOUNTER — Telehealth: Payer: Self-pay | Admitting: *Deleted

## 2023-10-06 NOTE — Telephone Encounter (Signed)
Faxed report of ZIO XT received - per monitor tech sending Dr. Lalla Brothers the message to review the report which is available in EPIC.

## 2023-10-06 NOTE — Progress Notes (Unsigned)
EP  CL to read.

## 2023-10-06 NOTE — Telephone Encounter (Signed)
Noted  

## 2023-10-06 NOTE — Telephone Encounter (Signed)
Natalie with Z[employer] is calling and will fax the abnormal heart monitor result reference number is [id number]

## 2023-10-12 ENCOUNTER — Other Ambulatory Visit: Payer: Self-pay | Admitting: Family Medicine

## 2023-10-12 ENCOUNTER — Telehealth: Payer: Self-pay

## 2023-10-12 DIAGNOSIS — I10 Essential (primary) hypertension: Secondary | ICD-10-CM

## 2023-10-12 NOTE — Telephone Encounter (Addendum)
Left a voice message asking for patient to give the office a call back.   ----- Message from Zandra Abts sent at 10/12/2023  7:47 AM EST ----- Your heart monitor has been reviewed by cardiology. Please follow up with the appointment you already have with cardiology later this month. I did reach out to cardiology to see if you needed to be seen earlier, but the appointment you have will be fine. Heart rate is varying with possible rhythm change. At your cardiology appointment, they will go in more detail about the results.

## 2023-10-17 ENCOUNTER — Ambulatory Visit: Payer: No Typology Code available for payment source | Admitting: Family Medicine

## 2023-10-19 NOTE — Telephone Encounter (Signed)
Patient seen in workcenter stated no issues after vaccines and new medication working better for her blood pressure improved and feeling better.  Has follow up scheduled with PCM and continuing home BP checks.  A&Ox3 spoke full sentences without difficulty respirations even and unlabored skin warm dry and pink gait sure and steady in warehouse

## 2023-10-25 ENCOUNTER — Ambulatory Visit: Payer: No Typology Code available for payment source | Admitting: Audiology

## 2023-10-27 ENCOUNTER — Ambulatory Visit: Payer: MEDICAID | Admitting: Registered Nurse

## 2023-10-27 VITALS — BP 110/70 | HR 65 | Temp 97.2°F | Resp 22

## 2023-10-27 DIAGNOSIS — J069 Acute upper respiratory infection, unspecified: Secondary | ICD-10-CM

## 2023-10-27 DIAGNOSIS — S60419A Abrasion of unspecified finger, initial encounter: Secondary | ICD-10-CM

## 2023-10-27 MED ORDER — SALINE SPRAY 0.65 % NA SOLN: 2.0000 | NASAL | Status: DC

## 2023-10-27 MED ORDER — LORATADINE 10 MG PO TABS: 10.0000 mg | ORAL_TABLET | Freq: Every day | ORAL | Status: DC

## 2023-10-27 MED ORDER — FLUTICASONE PROPIONATE 50 MCG/ACT NA SUSP: 1.0000 | Freq: Two times a day (BID) | NASAL | Status: DC

## 2023-10-27 MED ORDER — BENZONATATE 200 MG PO CAPS: 200.0000 mg | ORAL_CAPSULE | Freq: Three times a day (TID) | ORAL | 1 refills | Status: AC | PRN

## 2023-10-27 MED ORDER — COVID-19 ANTIGEN TEST VI KIT: 1.0000 | PACK | Freq: Every day | 1 refills | Status: DC | PRN

## 2023-10-27 NOTE — Patient Instructions (Signed)
How to Perform a Sinus Rinse A sinus rinse is a home treatment that is used to rinse your sinuses with a germ-free (sterile) mixture of salt and water (saline solution). Sinuses are air-filled spaces in your skull that are behind the bones of your face and forehead. They open into your nasal cavity. A sinus rinse can help to clear mucus, dirt, dust, or pollen from your nasal cavity. You may do a sinus rinse when you have a cold, a virus, nasal allergy symptoms, a sinus infection, or stuffiness in your nose or sinuses. What are the risks? A sinus rinse is generally safe and effective. However, there are a few risks, which include: A burning sensation in your sinuses. This may happen if you do not make the saline solution as directed. Be sure to follow all directions when making the saline solution. Nasal irritation. Infection. This may be from unclean supplies or from contaminated water. Infection from contaminated water is rare, but possible. Do not do a sinus rinse if you have had ear or nasal surgery, ear infection, or plugged ears, unless recommended by your health care provider. Supplies needed: Saline solution or powder. Distilled or sterile water to mix with saline powder. You may use boiled and cooled tap water. Boil tap water for 5 minutes; cool until it is lukewarm. Use within 24 hours. Do not use regular tap water to mix with the saline solution. Neti pot or nasal rinse bottle. These supplies release the saline solution into your nose and through your sinuses. Neti pots and nasal rinse bottles can be purchased at [employer], a health food store, or online. How to perform a sinus rinse  Wash your hands with soap and water for at least 20 seconds. If soap and water are not available, use hand sanitizer. Wash your device according to the directions that came with the product and then dry it. Use the solution that comes with your product or one that is sold separately in stores.  Follow the mixing directions on the package to mix with sterile or distilled water. Fill the device with the amount of saline solution noted in the device instructions. Stand by a sink and tilt your head sideways over the sink. Place the spout of the device in your upper nostril (the one closer to the ceiling). Gently pour or squeeze the saline solution into your nasal cavity. The liquid should drain out from the lower nostril if you are not too congested. While rinsing, breathe through your open mouth. Gently blow your nose to clear any mucus and rinse solution. Blowing too hard may cause ear pain. Turn your head in the other direction and repeat in your other nostril. Clean and rinse your device with clean water and then air-dry it. Talk with your health care provider or pharmacist if you have questions about how to do a sinus rinse. Summary A sinus rinse is a home treatment that is used to rinse your sinuses with a sterile mixture of salt and water (saline solution). You may do a sinus rinse when you have a cold, a virus, nasal allergy symptoms, a sinus infection, or stuffiness in your nose or sinuses. A sinus rinse is generally safe and effective. Follow all instructions carefully. This information is not intended to replace advice given to you by your health care provider. Make sure you discuss any questions you have with your health care provider. Document Revised: 05/11/2021 Document Reviewed: 05/11/2021 Elsevier Patient Education  2024 [employer] Viral  Respiratory Infection A respiratory infection is an illness that affects part of the respiratory system, such as the lungs, nose, or throat. A respiratory infection that is caused by a virus is called a viral respiratory infection. Common types of viral respiratory infections include: A cold. The flu (influenza). A respiratory syncytial virus (RSV) infection. What are the causes? This condition is caused by a virus. The virus may  spread through contact with droplets or direct contact with infected people or their mucus or secretions. The virus may spread from person to person (is contagious). What are the signs or symptoms? Symptoms of this condition include: A stuffy or runny nose. A sore throat or cough. Shortness of breath or difficulty breathing. Yellow or green mucus (sputum). Other symptoms may include: A fever. Sweating or chills. Fatigue. Achy muscles. A headache. How is this diagnosed? This condition may be diagnosed based on: Your symptoms. A physical exam. Testing of secretions from the nose or throat. Chest X-ray. How is this treated? This condition may be treated with medicines, such as: Antiviral medicine. This may shorten the length of time a person has symptoms. Expectorants. These make it easier to cough up mucus. Decongestant nasal sprays. Acetaminophen or NSAIDs, such as ibuprofen, to relieve fever and pain. Antibiotic medicines are not prescribed for viral infections.This is because antibiotics are designed to kill bacteria. They do not kill viruses. Follow these instructions at home: Managing pain and congestion Take over-the-counter and prescription medicines only as told by your health care provider. If you have a sore throat, gargle with a mixture of salt and water 3-4 times a day or as needed. To make salt water, completely dissolve -1 tsp (3-6 g) of salt in 1 cup (237 mL) of warm water. Use nose drops made from salt water to ease congestion and soften raw skin around your nose. Take 2 tsp (10 mL) of honey at bedtime to lessen coughing at night. Do not give honey to children who are younger than 1 year. Drink enough fluid to keep your urine pale yellow. This helps prevent dehydration and helps loosen up mucus. General instructions  Rest as much as possible. Do not drink alcohol. Do not use any products that contain nicotine or tobacco. These products include cigarettes, chewing  tobacco, and vaping devices, such as e-cigarettes. If you need help quitting, ask your health care provider. Keep all follow-up visits. This is important. How is this prevented?     Get an annual flu shot. You may get the flu shot in late summer, fall, or winter. Ask your health care provider when you should get your flu shot. Avoid spreading your infection to other people. If you are sick: Wash your hands with soap and water often, especially after you cough or sneeze. Wash for at least 20 seconds. If soap and water are not available, use alcohol-based hand sanitizer. Cover your mouth when you cough. Cover your nose and mouth when you sneeze. Do not share cups or eating utensils. Clean commonly used objects often. Clean commonly touched surfaces. Stay home from work or school as told by your health care provider. Avoid contact with people who are sick during cold and flu season. This is generally fall and winter. Contact a health care provider if: Your symptoms last for 10 days or longer. Your symptoms get worse over time. You have severe sinus pain in your face or forehead. The glands in your jaw or neck become very swollen. You have shortness of breath. Get  help right away if you: Feel pain or pressure in your chest. Have trouble breathing. Faint or feel like you will faint. Have severe and persistent vomiting. Feel confused or disoriented. These symptoms may represent a serious problem that is an emergency. Do not wait to see if the symptoms will go away. Get medical help right away. Call your local emergency services (911 in the [place] Do not drive yourself to the hospital. Summary A respiratory infection is an illness that affects part of the respiratory system, such as the lungs, nose, or throat. A respiratory infection that is caused by a virus is called a viral respiratory infection. Common types of viral respiratory infections include a cold, influenza, and respiratory  syncytial virus (RSV) infection. Symptoms of this condition include a stuffy or runny nose, cough, fatigue, achy muscles, sore throat, and fevers or chills. Antibiotic medicines are not prescribed for viral infections. This is because antibiotics are designed to kill bacteria. They are not effective against viruses. This information is not intended to replace advice given to you by your health care provider. Make sure you discuss any questions you have with your health care provider. Document Revised: 02/26/2021 Document Reviewed: 02/26/2021 Elsevier Patient Education  2024 [employer] Inc. Abrasion An abrasion is a cut or scrape that affects only the surface of the skin. The injury doesn't go through all the layers of the skin. Still, an abrasion can cause pain because it leaves the skin and its nerves open. Abrasions are usually minor injuries that can be treated at home. You must care for your abrasion to prevent infection. What are the causes? Abrasions happen when something rubs, scrapes, or scratches your skin. This can happen when you fall on a hard or rough surface. Or, when a bush in your yard scratches you. When your skin rubs against something, some layers of skin may come off. You may also see tiny tears on your skin. What are the signs or symptoms? The main symptom of this condition is a cut or a scrape. The cut or scrape may bleed. It may also appear red or pink. If your abrasion is caused by a fall, there may be a bruise under your cut or scrape. How is this diagnosed? An abrasion is diagnosed with a physical exam. How is this treated? Treatment for this condition depends on how large and deep the abrasion is. In most cases: Your abrasion will be cleaned with water and mild soap. An antibiotic may be put on the wound to prevent infection. A petroleum jelly may be put on the wound to prevent moisture. A bandage may be placed on your abrasion to keep it clean. You may need a tetanus  shot. Follow these instructions at home: Medicines Take over-the-counter and prescription medicines only as told by your health care provider. If you were prescribed antibiotics, use them as told by your provider. Do not stop using them even if you start to feel better. Wound care Clean your wound 1-2 times a day, or as told by your provider. Wash your hands for at least 20 seconds with mild soap and water. Do this before and after you clean your wound. If soap and water are not available, use hand sanitizer to clean your hands. Wash your wound using mild soap and water, a wound cleanser, or a saltwater solution. A saltwater solution is also called saline. Do not use hydrogen peroxide or alcohol. These can slow healing. Rinse off the soap. Pat your wound dry with  a clean towel. Do not rub your wound. Put an antibiotic ointment on your wound as told by your provider. You may also be told to put on a jelly that prevents moisture from entering the wound. Cover your wound with a bandage as told by your provider. Small or very minor abrasions may not need a bandage. Keep your bandage clean and dry as told by your provider. There are many ways to close and cover a wound. Follow instructions from your provider about changing and removing your bandage. You may have to change your bandage one or more times a day, or as told by your provider. Check your wound every day for signs of infection. Check for: Redness. Watch for a red streak that spreads out from your wound. Swelling or worse pain. Warmth. Blood, fluid, pus, or a bad smell. Managing pain and swelling  If told, put ice on the injured area. Put ice in a plastic bag. Place a towel between your skin and the bag. Leave the ice on for 20 minutes, 2-3 times a day. If your skin turns bright red, remove the ice right away to prevent skin damage. The risk of damage is higher if you cannot feel pain, heat, or cold. If possible, raise the injured  area above the level of your heart while you are sitting or lying down. General instructions Do not take baths, swim, or use a hot tub until your provider approves. Ask your provider if you may take showers. You may only be allowed to take sponge baths. Do not scratch or pick at scabs that may occur over the wound as it heals. Contact a health care provider if: You got a tetanus shot, and you have swelling, severe pain, redness, or bleeding at the site of your shot. Your pain is worse and medicines do not help. You have a fever. You have any of signs of infection. Get help right away if: You have a red streak spreading away from your wound. This information is not intended to replace advice given to you by your health care provider. Make sure you discuss any questions you have with your health care provider. Document Revised: 01/28/2023 Document Reviewed: 01/28/2023 Elsevier Patient Education  2024 [employer].

## 2023-10-27 NOTE — Progress Notes (Signed)
Subjective:    Patient ID: Sonya Trujillo, female    DOB: [birth year], 47 y.o.   MRN: [id number]  47y/o caucasian female established patient here for evaluation scratchy throat and dry cough started in past 24 hours.  Denied fever/chills/n/v/d.  Using nasal saline in the shower at home and tylenol or ibuprofen po prn otc.  Not taking any daily antihistamine for allergies.  Denied known sick contacts.  Has not home covid tested has not ordered free covid tests from [employer] site.  Has not picked up 4 covered covid tests from pharmacy with insurance for this month.  Here today for covid test      Review of Systems  Constitutional:  Positive for fatigue. Negative for chills, diaphoresis and fever.  HENT:  Positive for postnasal drip and sore throat. Negative for ear discharge, ear pain, facial swelling, hearing loss, mouth sores, nosebleeds, trouble swallowing and voice change.   Eyes:  Negative for photophobia and visual disturbance.  Respiratory:  Positive for cough. Negative for choking, chest tightness, shortness of breath, wheezing and stridor.   Cardiovascular:  Negative for chest pain and palpitations.  Gastrointestinal:  Negative for abdominal pain, diarrhea, nausea and vomiting.  Genitourinary:  Negative for difficulty urinating.  Musculoskeletal:  Negative for back pain, gait problem, neck pain and neck stiffness.  Skin:  Negative for rash.  Neurological:  Negative for tremors, syncope, speech difficulty and weakness.  Hematological:  Negative for adenopathy.  Psychiatric/Behavioral:  Negative for sleep disturbance.        Objective:   Physical Exam Vitals and nursing note reviewed.  Constitutional:      General: She is awake. She is not in acute distress.    Appearance: Normal appearance. She is well-developed and well-groomed. She is obese. She is not ill-appearing, toxic-appearing or diaphoretic.  HENT:     Head: Normocephalic and atraumatic.     Jaw: There is normal jaw  occlusion. No trismus, tenderness, swelling or pain on movement.     Salivary Glands: Right salivary gland is not diffusely enlarged or tender. Left salivary gland is not diffusely enlarged or tender.     Right Ear: Hearing, ear canal and external ear normal. No decreased hearing noted. No laceration, drainage, swelling or tenderness. A middle ear effusion is present. There is no impacted cerumen. No foreign body. No mastoid tenderness. No PE tube. No hemotympanum. Tympanic membrane is not injected, scarred, perforated, erythematous, retracted or bulging.     Left Ear: Hearing, ear canal and external ear normal. No decreased hearing noted. No laceration, drainage, swelling or tenderness. There is impacted cerumen. Tympanic membrane is not erythematous.     Ears:     Comments: Cerumen impaction left obscuring TM 100% appears soft gold refused irrigation today; right TM intact air fluid level clear cerumen noted 6 oclock auditory canal right    Nose: Mucosal edema, congestion and rhinorrhea present. No nasal deformity, septal deviation or laceration. Rhinorrhea is clear.     Right Nostril: No epistaxis.     Left Nostril: No epistaxis.     Right Turbinates: Not enlarged, swollen or pale.     Left Turbinates: Not enlarged, swollen or pale.     Right Sinus: Maxillary sinus tenderness and frontal sinus tenderness present.     Left Sinus: Maxillary sinus tenderness and frontal sinus tenderness present.     Comments: Mild TTP maxillary and frontal sinuses bilaterally; mild cobblestoning without erythema posterior pharynx; bilateral allergic shiners; clear discharge bilateral nasal turbinates  Mouth/Throat:     Lips: Pink. No lesions.     Mouth: Mucous membranes are moist. Mucous membranes are not pale, not dry and not cyanotic. No lacerations, oral lesions or angioedema.     Dentition: No dental abscesses or gum lesions.     Tongue: No lesions. Tongue does not deviate from midline.     Palate: No mass  and lesions.     Pharynx: Uvula midline. Pharyngeal swelling, posterior oropharyngeal erythema and postnasal drip present. No oropharyngeal exudate or uvula swelling.     Tonsils: No tonsillar exudate or tonsillar abscesses. 1+ on the right. 1+ on the left.  Eyes:     General: Lids are normal. Vision grossly intact. Gaze aligned appropriately. Allergic shiner present. No scleral icterus.       Right eye: No foreign body, discharge or hordeolum.        Left eye: No foreign body, discharge or hordeolum.     Extraocular Movements: Extraocular movements intact.     Right eye: Normal extraocular motion and no nystagmus.     Left eye: Normal extraocular motion and no nystagmus.     Conjunctiva/sclera: Conjunctivae normal.     Right eye: Right conjunctiva is not injected. No chemosis, exudate or hemorrhage.    Left eye: Left conjunctiva is not injected. No chemosis, exudate or hemorrhage.    Pupils: Pupils are equal, round, and reactive to light. Pupils are equal.     Right eye: Pupil is round and reactive.     Left eye: Pupil is round and reactive.  Neck:     Thyroid: No thyroid mass or thyromegaly.     Trachea: Trachea and phonation normal. No tracheal tenderness or tracheal deviation.  Cardiovascular:     Rate and Rhythm: Normal rate and regular rhythm.     Pulses:          Radial pulses are 2+ on the right side and 2+ on the left side.     Heart sounds: Normal heart sounds, S1 normal and S2 normal. Heart sounds not distant. No murmur heard.    No friction rub. No gallop.  Pulmonary:     Effort: Pulmonary effort is normal. No accessory muscle usage or respiratory distress.     Breath sounds: Normal breath sounds. No stridor. No decreased breath sounds, wheezing, rhonchi or rales.     Comments: Spoke full sentences in clinic without difficulty; no cough observed in exam room Chest:     Chest wall: No tenderness.  Abdominal:     General: Bowel sounds are normal. There is no distension.      Palpations: Abdomen is soft.     Tenderness: There is no guarding.  Musculoskeletal:        General: No tenderness. Normal range of motion.     Right hand: Normal strength. Normal capillary refill.     Left hand: Normal strength. Normal capillary refill.     Cervical back: Normal range of motion and neck supple. No edema, erythema, signs of trauma, rigidity, torticollis, tenderness or crepitus. No pain with movement or muscular tenderness. Normal range of motion.     Right hip: Normal.     Left hip: Normal.     Right knee: Normal.     Left knee: Normal.     Right lower leg: No edema.     Left lower leg: No edema.  Lymphadenopathy:     Head:     Right side of head: No submental, submandibular,  tonsillar, preauricular, posterior auricular or occipital adenopathy.     Left side of head: No submental, submandibular, tonsillar, preauricular, posterior auricular or occipital adenopathy.     Cervical: No cervical adenopathy.     Right cervical: No superficial, deep or posterior cervical adenopathy.    Left cervical: No superficial, deep or posterior cervical adenopathy.  Skin:    General: Skin is warm and dry.     Capillary Refill: Capillary refill takes less than 2 seconds.     Coloration: Skin is not ashen, cyanotic, jaundiced, mottled, pale or sallow.     Findings: Abrasion present. No abscess, acne, bruising, burn, ecchymosis, erythema, signs of injury, laceration, lesion, petechiae, rash or wound.     Nails: There is no clubbing.  Neurological:     General: No focal deficit present.     Mental Status: She is alert and oriented to person, place, and time. Mental status is at baseline. She is not disoriented.     GCS: GCS eye subscore is 4. GCS verbal subscore is 5. GCS motor subscore is 6.     Cranial Nerves: No cranial nerve deficit, dysarthria or facial asymmetry.     Sensory: Sensation is intact. No sensory deficit.     Motor: Motor function is intact. No weakness, tremor, atrophy,  abnormal muscle tone or seizure activity.     Coordination: Coordination is intact. Coordination normal.     Gait: Gait is intact. Gait normal.     Comments: In/out of chair without difficulty; gait sure and steady; bilateral hand grasp equal 5/5  Psychiatric:        Attention and Perception: Attention and perception normal.        Mood and Affect: Mood and affect normal.        Speech: Speech normal.        Behavior: Behavior normal. Behavior is cooperative.        Thought Content: Thought content normal.        Cognition and Memory: Cognition and memory normal.        Judgment: Judgment normal.    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  Last tdap 08/2023 current.  Washed with soap and water and bleeding stopping.  Continue to monitor for signs of infection and notify clinic staff if these symptoms develop. May shower apply neosporin/triple antibiotic BID keep wounds covered they will heal faster and prevent contamination rubbing from clothing tearing off scabs.  change at least daily after washing with soap and water.  Change prn soiling. Exitcare handout on abrasion tylenol or ibuprofen prn pain Medications as directed. Call or return to clinic as needed if these symptoms worsen or fail to improve as anticipated or worsening swelling/pain/red streaks from wound/discharge e.g. signs of infection. Patient verbalized agreement and understanding of treatment plan. P2: ROM, injury prevention         Assessment & Plan:  A-viral URI with cough, abrasion right hand initial encounter  P-ran out of free [place] govt tests onsite.  Patient assisted with ordering free tests on website x4 today.  Patient given electronic Rx covid antigen test nasal UUD daily prn #4 RF1 sent to her pharmacy of choice as insurance plan covers 4 tests per month with Rx at [facility] .  Patient may use normal saline nasal spray 2 sprays each nostril q2h wa as  needed. flonase nasal spray 1 spray each nostril BID OTC.  Use  saline first then fluticasone (flonase).  Given 4 UD loratadine 10mg  from clinic stock for trial and 1 phenylephrine 10mg  po x 1 now.  Discussed may buy OTC cough/cold medication of her choice if nose sprays/loratadine (claritin) not working well enough to control congestion/post nasal drip.  Discussed sore throat from post nasal drip irritation.  Honey 1 tablespoon po q4h prn cough/sore throat or salt water gargles can be helpful.  Tylenol 1000mg  po q6h prn pain or ibuprofen 800mg  po q8h prn pain take with food.  I prefer she start with tylenol as it does not increase blood pressure as much as NSAID with her hypertension/counteracts her medications..  Patient denied personal or family history of ENT cancer.  OTC antihistamine of choice claritin/zyrtec 10mg  po daily.  Avoid triggers if possible.  Shower prior to bedtime if exposed to triggers.  If allergic dust/dust mites recommend mattress/pillow covers/encasements; washing linens, vacuuming, sweeping, dusting weekly.  Call or return to clinic as needed if these symptoms worsen or fail to improve as anticipated.   Exitcare handout on allergic rhinitis and sinus rinse given to patient.  Patient verbalized understanding of instructions, agreed with plan of care and had no further questions at this time.  P2:  Avoidance and hand washing.
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
## 2023-11-02 ENCOUNTER — Ambulatory Visit: Payer: No Typology Code available for payment source | Admitting: Cardiovascular Disease

## 2023-11-08 ENCOUNTER — Ambulatory Visit: Payer: No Typology Code available for payment source | Admitting: Family Medicine

## 2023-11-08 ENCOUNTER — Other Ambulatory Visit: Payer: Self-pay | Admitting: Family

## 2023-11-08 ENCOUNTER — Telehealth: Payer: Self-pay | Admitting: Family Medicine

## 2023-11-08 NOTE — Telephone Encounter (Signed)
Prescription Request  11/08/2023  LOV: 09/27/2023  What is the name of the medication or equipment? Hydroxyzine.   Have you contacted your pharmacy to request a refill? No   Which pharmacy would you like this sent to?  [id number] - [place], [place] - [address] AT [facility] [address] [place] [address] Phone: [phone] Fax: [phone]    Patient notified that their request is being sent to the clinical staff for review and that they should receive a response within 2 business days.   Please advise at Mobile [phone] (mobile)

## 2023-11-10 ENCOUNTER — Ambulatory Visit: Payer: MEDICAID | Admitting: *Deleted

## 2023-11-10 ENCOUNTER — Encounter: Payer: Self-pay | Admitting: *Deleted

## 2023-11-10 DIAGNOSIS — S61210A Laceration without foreign body of right index finger without damage to nail, initial encounter: Secondary | ICD-10-CM

## 2023-11-10 MED ORDER — TRIPLE ANTIBIOTIC 3.5-400-5000 EX OINT: 1.0000 | TOPICAL_OINTMENT | Freq: Two times a day (BID) | CUTANEOUS | Status: AC | PRN

## 2023-11-10 NOTE — Progress Notes (Signed)
Verified no vitals needed for this patient at this time. Reviewed chart visit.

## 2023-11-10 NOTE — Progress Notes (Signed)
S-patient loaned to packing was trying to cut paper on edge and accidentally sliced finger without nail damage bleeding stopped with pressure O-0.5cm linear laceration lateral right index finger distal adjacent to nail fold wound edges well approximated beefy red wound base oozing capillary bleeding A-laceration finger initial encounter without damage to nail P-keep covered at work Tetanus up to date.  Wound well approximated not gaping did not required steristrips or sutures.   Do not soak right hand in dirty water until laceration/abrasions healed avoid pool, lake, hot tub, dirty sink water. May shower apply neosporin/triple antibiotic BID keep wounds covered they will heal faster and prevent contamination rubbing from clothing tearing off scabs.  Given triple antibiotic and bandaids from clinic stock change at least daily after washing with soap and water.  Change prn soiling. Exitcare handout on laceration care/abrasion  Tylenol 1000mg  po q6h prn pain over the counter  may ice 15 minutes prn swelling four times per day. Medications as directed. Call or return to clinic as needed if these symptoms worsen or fail to improve as anticipated or if worsening pain/discharge/foreign object seen.Patient verbalized agreement and understanding of treatment plan. P2: ROM, injury

## 2023-11-10 NOTE — Patient Instructions (Signed)
Laceration Care, Adult A laceration is a cut that may go through all layers of the skin and into the tissue that is right under the skin. Some lacerations heal on their own. Others need to be closed with stitches (sutures), staples, skin adhesive strips, or skin glue. Proper care of a laceration reduces the risk for infection, helps the laceration heal better, and may prevent scarring. General tips Keep the wound clean and dry. Do not scratch or pick at the wound. Wash your hands with soap and water for at least 20 seconds before and after touching your wound or changing your bandage (dressing). If soap and water are not available, use hand sanitizer. Do not usedisinfectants or antiseptics, such as rubbing alcohol, to clean your wound unless told by your health care provider. If you were given a dressing, you should change it at least once a day, or as told by your health care provider. You should also change it if it becomes wet or dirty. How to care for your laceration If sutures or staples were used: Keep the wound completely dry for the first 24 hours, or as told by your health care provider. After that time, you may shower or bathe. Do not soak your wound in water until after the sutures or staples have been removed. Clean the wound once each day, or as told by your health care provider. To do this: Wash the wound with soap and water. Rinse the wound with water to remove all soap. Pat the wound dry with a clean towel. Do not rub the wound. After cleaning the wound, apply a thin layer of antibiotic ointment, other topical ointments, or a non-adherent dressing as told by your health care provider. This will help prevent infection and keep the dressing from sticking to the wound. Have the sutures or staples removed as told by your health care provider. Do not  remove sutures or staples yourself. If skin adhesive strips were used: Do not get the skin adhesive strips wet. You may shower or bathe,  but keep the wound dry. If the wound gets wet, pat it dry with a clean towel. Do not rub the wound. Skin adhesive strips fall off on their own. If adhesive strip edges start to loosen and curl up, you may trim the loose edges. Do not remove adhesive strips completely unless your health care provider tells you to do that. If skin glue was used: You may shower or bathe, but try to keep the wound dry. Do not soak the wound in water. After showering or bathing, pat the wound dry with a clean towel. Do not rub the wound. Do not do any activities that will make you sweat a lot until the skin glue has fallen off. Do not apply liquid, cream, or ointment medicine to the wound while the skin glue is in place. Doing this may loosen the film before the wound has healed. If a dressing is placed over the wound, do not apply tape directly over the skin glue. Doing this may cause the glue to be pulled off before the wound has healed. Do not pick at the glue. Skin glue usually remains in place for 5-10 days and then falls off the skin. Follow these instructions at home: Medicines Take over-the-counter and prescription medicines only as told by your health care provider. If you were prescribed an antibiotic medicine or ointment, take or apply it as told by your health care provider. Do not stop using it even if  your condition improves. Managing pain and swelling If directed, put ice on the injured area. To do this: Put ice in a plastic bag. Place a towel between your skin and the bag. Leave the ice on for 20 minutes, 2-3 times a day. Remove the ice if your skin turns bright red. This is very important. If you cannot feel pain, heat, or cold, you have a greater risk of damage to the area. Raise (elevate) the injured area above the level of your heart while you are sitting or lying down for the first 24-48 hours after the laceration is repaired. General instructions  Avoid any activity that could cause your wound  to reopen. Check your wound every day for signs of infection. Watch for: More redness, swelling, or pain. Fluid or blood. Warmth. Pus or a bad smell. Keep all follow-up visits. This is important. Contact a health care provider if: You received a tetanus shot and you have swelling, severe pain, redness, or bleeding at the injection site. Your closed wound breaks open. You have any of these signs of infection: More redness, swelling, or pain around your wound. Fluid or blood coming from your wound. Warmth coming from your wound. Pus or a bad smell coming from your wound. A fever. You notice something coming out of the wound, such as wood or glass. Your pain is not controlled with medicine. You notice a change in the color of your skin near your wound. You need to change the dressing often. You develop a new rash. You have numbness around the wound. Get help right away if: You develop severe swelling around the wound. Your pain suddenly increases and is severe. You develop painful lumps near the wound or on skin anywhere else on your body. You have a red streak going away from your wound. The wound is on your hand or foot, and you cannot properly move a finger or toe. The wound is on your hand or foot, and you notice that your fingers or toes look pale or bluish. Summary A laceration is a cut that may go through all layers of the skin and into the tissue that is right under the skin. Some lacerations heal on their own. Others need to be closed with stitches (sutures), staples, skin adhesive strips, or skin glue. Proper care of a laceration reduces the risk of infection, helps the laceration heal better, and may prevent scarring. This information is not intended to replace advice given to you by your health care provider. Make sure you discuss any questions you have with your health care provider. Document Revised: 01/29/2021 Document Reviewed: 01/29/2021 Elsevier Patient Education   2024 [employer].

## 2023-11-24 ENCOUNTER — Ambulatory Visit: Payer: No Typology Code available for payment source | Attending: Family Medicine | Admitting: Audiology

## 2023-11-24 DIAGNOSIS — H903 Sensorineural hearing loss, bilateral: Secondary | ICD-10-CM | POA: Insufficient documentation

## 2023-11-24 DIAGNOSIS — H9313 Tinnitus, bilateral: Secondary | ICD-10-CM | POA: Insufficient documentation

## 2023-11-24 NOTE — Procedures (Signed)
Outpatient Audiology and [facility] [address] [place], [place]  [address] [phone]  AUDIOLOGICAL  EVALUATION  NAME: Sonya Trujillo     DOB:   24-Apr-1976      MRN: [id number]                                                                                     DATE: 11/24/2023     REFERENT: Alveria Apley, NP STATUS: Outpatient DIAGNOSIS: Sensorineural hearing loss, tinnitus  History: Sonya Trujillo was seen for an audiological evaluation due to decreased hearing occurring for many years.  Also reports that she first noted hearing loss at the age of 24 after being on the shooting range.  Sonya Trujillo reports she has noticed recently she has greater difficulty hearing and communicating at work and with her friends.  Sonya Trujillo reports a history of tinnitus which she describes as constant and very bothersome.  Also reports the tinnitus is starting to interfere with her everyday life.  Sonya Trujillo has a history of anxiety. She denies otalgia and aural fullness.  Sonya Trujillo reports intermittent dizzy spells.   Evaluation:  Otoscopy showed non-occluding cerumen, bilaterally. Tympanometry results were consistent with normal middle ear pressure and normal tympanic membrane mobility (Type A) bilaterally Audiometric testing was completed using Conventional Audiometry techniques with insert earphones and TDH headphones. Test results are consistent with in the right ear with normal hearing sensitivity sloping to a severe sensorineural hearing loss and are consistent in the left ear with normal hearing sensitivity sloping to a moderately severe sensorineural hearing loss.  There is an asymmetry noted at 2000 Hz, worse in the left ear there is an asymmetry noted at 4000 to 8000 Hz, worse in the right ear. Speech Recognition Thresholds were obtained at 20 dB HL in the right ear and at 20  dB HL in the left ear. Word Recognition Testing was completed at 65 dB HL and Sonya Trujillo scored 100% in the right ear  and 92% in the left ear.  Results:  The test results were reviewed with Sonya Trujillo. Test results are consistent with in the right ear with normal hearing sensitivity sloping to a severe sensorineural hearing loss and are consistent in the left ear with normal hearing sensitivity sloping to a moderately severe sensorineural hearing loss.  There is an asymmetry noted at 2000 Hz, worse in the left ear there is an asymmetry noted at 4000 to 8000 Hz, worse in the right ear.  Sonya Trujillo will have hearing and communication difficulty in many listening environments.  She will benefit from the use of good communication strategies and the use of hearing aids.  Sonya Trujillo was given a list of hearing aid providers in the [place] area.  A referral to an ear nose and throat physician was reviewed with Sonya Trujillo due to her asymmetric hearing loss.  Tinnitus management strategies were reviewed and Sonya Trujillo was given information on tinnitus.  Sonya Trujillo was also counseled regarding the [facility] tinnitus clinic if she needs a further tinnitus management.  Recommendations: 1.   Referral to an ear nose and throat physician due to asymmetric sensorineural hearing loss and tinnitus. 2.   Communication needs assessment by an audiologist to  discuss hearing aids 3.   Tinnitus evaluation by the [facility] speech and hearing tinnitus clinic if tinnitus worsens.  30 minutes spent testing and counseling on results.   If you have any questions please feel free to contact me at [phone].  Marton Redwood Audiologist, Au.D., CCC-A 11/24/2023  3:12 PM  Cc: Alveria Apley, NP

## 2023-12-07 ENCOUNTER — Other Ambulatory Visit: Payer: Self-pay | Admitting: Medical Genetics

## 2023-12-12 ENCOUNTER — Ambulatory Visit (INDEPENDENT_AMBULATORY_CARE_PROVIDER_SITE_OTHER): Payer: No Typology Code available for payment source | Admitting: Family Medicine

## 2023-12-12 VITALS — BP 140/80 | HR 66 | Temp 98.7°F | Ht 66.0 in | Wt 248.2 lb

## 2023-12-12 DIAGNOSIS — I471 Supraventricular tachycardia, unspecified: Secondary | ICD-10-CM

## 2023-12-12 DIAGNOSIS — I10 Essential (primary) hypertension: Secondary | ICD-10-CM | POA: Diagnosis not present

## 2023-12-12 MED ORDER — OLMESARTAN MEDOXOMIL 40 MG PO TABS: 40.0000 mg | ORAL_TABLET | Freq: Every day | ORAL | 0 refills | Status: AC

## 2023-12-12 NOTE — Patient Instructions (Addendum)
 I printed out the contact information for counseling services.  Reach out to them in regards to the new patient paperwork and setting up appointment.  If new referral is needed please notify the clinic so we can place this for you.  Continue monitoring your blood pressure at home.  For elevations consistently greater than 140/90 notify clinic for additional medication can be sent in.

## 2023-12-12 NOTE — Progress Notes (Signed)
 Established Patient Office Visit   Subjective  Patient ID: Sonya Trujillo, female    DOB: 1976/09/19  Age: 48 y.o. MRN: [id number]  Chief Complaint  Patient presents with   Medical Management of Chronic Issues    Patient is a 48 year old female followed by Philippe Slade, NP and seen for follow-up on BP as PCP out of office.  Patient last seen by PCP on 09/27/23 and advised to follow-up in a few weeks as olmesartan  increased to 40 mg daily.  Pt feeling better, having less dizziness since med adjusted.  Pt forgot bp log, but states readings are 120/80-140/90.    Pt had Zio patch x 13 days during last OFV.  Saw results online.  Has a Cardiology appt in a few wks to discuss further.  17 episodes of SVT noted and bradycardia at 34 bpm.  Pt denies current symptoms.  Patient inquires about referral to counseling.  States never received any follow-up regarding scheduling appointment.    Patient Active Problem List   Diagnosis Date Noted   Hypertension 09/13/2023   Elevated AST (SGOT) 07/26/2023   Elevated ALT measurement 07/26/2023   Intractable headache 07/26/2023   Elevated blood pressure reading in office without diagnosis of hypertension 03/06/2021   Borderline personality disorder (HCC) 03/06/2021   PTSD (post-traumatic stress disorder) 03/06/2021   Vitamin D  deficiency 03/06/2021   Migraine without aura 10/23/2007   OBESITY NOS 03/10/2007   ECZEMA, ATOPIC DERMATITIS 02/02/2007   Past Medical History:  Diagnosis Date   Borderline personality disorder (HCC)    Migraine    Panic attack    PTSD (post-traumatic stress disorder)    Past Surgical History:  Procedure Laterality Date   TUBAL LIGATION     Social History   Tobacco Use   Smoking status: Former    Current packs/day: 0.00    Average packs/day: 0.5 packs/day for 6.0 years (2.7 ttl pk-yrs)    Types: Cigarettes    Start date: 12/06/2001    Quit date: 12/07/2007    Years since quitting: 16.0   Smokeless tobacco:  Never  Vaping Use   Vaping status: Never Used  Substance Use Topics   Alcohol use: Yes    Comment: Once every 2 months; 3-5 drinks   Drug use: Yes    Types: Marijuana    Comment: Twice a week   Family History  Problem Relation Age of Onset   Arthritis Mother    Osteoporosis Mother    CAD Father    Stroke Father    Post-traumatic stress disorder Father    Bipolar disorder Father    Cancer Father        Colon   Diabetes Father    Alcohol abuse Father    No Known Allergies    ROS Negative unless stated above    Objective:     BP (!) 140/80 (BP Location: Left Arm, Patient Position: Sitting, Cuff Size: Large)   Pulse 66   Temp 98.7 F (37.1 C) (Oral)   Ht 5' 6 (1.676 m)   Wt 248 lb 3.2 oz (112.6 kg)   LMP 12/03/2023 (Exact Date)   SpO2 98%   BMI 40.06 kg/m  BP Readings from Last 3 Encounters:  12/12/23 (!) 140/80  10/27/23 110/70  09/27/23 (!) 152/82   Wt Readings from Last 3 Encounters:  12/12/23 248 lb 3.2 oz (112.6 kg)  09/27/23 228 lb 9.6 oz (103.7 kg)  09/13/23 236 lb 12.8 oz (107.4 kg)  Physical Exam Constitutional:      General: She is not in acute distress.    Appearance: Normal appearance.  HENT:     Head: Normocephalic and atraumatic.     Nose: Nose normal.     Mouth/Throat:     Mouth: Mucous membranes are moist.  Cardiovascular:     Rate and Rhythm: Normal rate and regular rhythm.  Pulmonary:     Effort: Pulmonary effort is normal.     Breath sounds: Normal breath sounds.  Skin:    General: Skin is warm and dry.  Neurological:     Mental Status: She is alert and oriented to person, place, and time.     No results found for any visits on 12/12/23.    Assessment & Plan:  Essential hypertension -elevated -continue olmesartan  40 mg daily -Lifestyle modifications encouraged -discussed additional medication.  Pt wishes to wait at this time. -Monitor BP daily at home.  For elevations consistently greater than 140/90 patient to  notify clinic for med adjustments.  Consider adding Norvasc. -     Olmesartan  Medoxomil; Take 1 tablet (40 mg total) by mouth daily.  Dispense: 90 tablet; Refill: 0  SVT (supraventricular tachycardia) (HCC) -Stable -Noted on ZIO monitor -Encouraged to keep upcoming appointment with cardiology  Patient given contact for psychology referral.  Per chart review patient was to complete new patient paperwork then contact office regarding scheduling appointment.   Return in about 6 weeks (around 01/23/2024).   Clotilda JONELLE Single, MD

## 2023-12-20 ENCOUNTER — Encounter: Payer: Self-pay | Admitting: Registered Nurse

## 2023-12-20 ENCOUNTER — Ambulatory Visit: Payer: MEDICAID | Admitting: Registered Nurse

## 2023-12-20 VITALS — HR 82 | Temp 98.2°F | Resp 16

## 2023-12-20 DIAGNOSIS — J069 Acute upper respiratory infection, unspecified: Secondary | ICD-10-CM

## 2023-12-20 DIAGNOSIS — U071 COVID-19: Secondary | ICD-10-CM

## 2023-12-20 MED ORDER — PSEUDOEPH-BROMPHEN-DM 30-2-10 MG/5ML PO SYRP: 5.0000 mL | ORAL_SOLUTION | Freq: Four times a day (QID) | ORAL | 0 refills | Status: DC | PRN

## 2023-12-20 MED ORDER — COVID-19 ANTIGEN TEST VI KIT: 1.0000 | PACK | Freq: Every day | 1 refills | Status: AC | PRN

## 2023-12-20 MED ORDER — NIRMATRELVIR/RITONAVIR (PAXLOVID)TABLET: 3.0000 | ORAL_TABLET | Freq: Two times a day (BID) | ORAL | 0 refills | Status: AC

## 2023-12-20 NOTE — Patient Instructions (Addendum)
 Person Under Monitoring Name: Sonya Trujillo  Location: [address] [place] [address]   Infection Prevention Recommendations for Individuals Confirmed to have, or Being Evaluated for, 2019 Novel Coronavirus (COVID-19) Infection Who Receive Care at Home  Individuals who are confirmed to have, or are being evaluated for, COVID-19 should follow the prevention steps below until a healthcare provider or local or state health department says they can return to normal activities.  Stay home except to get medical care You should restrict activities outside your home, except for getting medical care. Do not go to work, school, or public areas, and do not use public transportation or taxis.  Call ahead before visiting your doctor Before your medical appointment, call the healthcare provider and tell them that you have, or are being evaluated for, COVID-19 infection. This will help the healthcare provider's office take steps to keep other people from getting infected. Ask your healthcare provider to call the local or state health department.  Monitor your symptoms Seek prompt medical attention if your illness is worsening (e.g., difficulty breathing). Before going to your medical appointment, call the healthcare provider and tell them that you have, or are being evaluated for, COVID-19 infection. Ask your healthcare provider to call the local or state health department.  Wear a facemask You should wear a facemask that covers your nose and mouth when you are in the same room with other people and when you visit a healthcare provider. People who live with or visit you should also wear a facemask while they are in the same room with you.  Separate yourself from other people in your home As much as possible, you should stay in a different room from other people in your home. Also, you should use a separate bathroom, if available.  Avoid sharing household items You should  not share dishes, drinking glasses, cups, eating utensils, towels, bedding, or other items with other people in your home. After using these items, you should wash them thoroughly with soap and water.  Cover your coughs and sneezes Cover your mouth and nose with a tissue when you cough or sneeze, or you can cough or sneeze into your sleeve. Throw used tissues in a lined trash can, and immediately wash your hands with soap and water for at least 20 seconds or use an alcohol-based hand rub.  Wash your [employer] your hands often and thoroughly with soap and water for at least 20 seconds. You can use an alcohol-based hand sanitizer if soap and water are not available and if your hands are not visibly dirty. Avoid touching your eyes, nose, and mouth with unwashed hands.   Prevention Steps for Caregivers and Household Members of Individuals Confirmed to have, or Being Evaluated for, COVID-19 Infection Being Cared for in the Home  If you live with, or provide care at home for, a person confirmed to have, or being evaluated for, COVID-19 infection please follow these guidelines to prevent infection:  Follow healthcare provider's instructions Make sure that you understand and can help the patient follow any healthcare provider instructions for all care.  Provide for the patient's basic needs You should help the patient with basic needs in the home and provide support for getting groceries, prescriptions, and other personal needs.  Monitor the patient's symptoms If they are getting sicker, call his or her medical provider and tell them that the patient has, or is being evaluated for, COVID-19 infection. This will help the healthcare  provider's office take steps to keep other people from getting infected. Ask the healthcare provider to call the local or state health department.  Limit the number of people who have contact with the patient If possible, have only one caregiver for the  patient. Other household members should stay in another home or place of residence. If this is not possible, they should stay in another room, or be separated from the patient as much as possible. Use a separate bathroom, if available. Restrict visitors who do not have an essential need to be in the home.  Keep older adults, very young children, and other sick people away from the patient Keep older adults, very young children, and those who have compromised immune systems or chronic health conditions away from the patient. This includes people with chronic heart, lung, or kidney conditions, diabetes, and cancer.  Ensure good ventilation Make sure that shared spaces in the home have good air flow, such as from an air conditioner or an opened window, weather permitting.  Wash your hands often Wash your hands often and thoroughly with soap and water for at least 20 seconds. You can use an alcohol based hand sanitizer if soap and water are not available and if your hands are not visibly dirty. Avoid touching your eyes, nose, and mouth with unwashed hands. Use disposable paper towels to dry your hands. If not available, use dedicated cloth towels and replace them when they become wet.  Wear a facemask and gloves Wear a disposable facemask at all times in the room and gloves when you touch or have contact with the patient's blood, body fluids, and/or secretions or excretions, such as sweat, saliva, sputum, nasal mucus, vomit, urine, or feces.  Ensure the mask fits over your nose and mouth tightly, and do not touch it during use. Throw out disposable facemasks and gloves after using them. Do not reuse. Wash your hands immediately after removing your facemask and gloves. If your personal clothing becomes contaminated, carefully remove clothing and launder. Wash your hands after handling contaminated clothing. Place all used disposable facemasks, gloves, and other waste in a lined container before  disposing them with other household waste. Remove gloves and wash your hands immediately after handling these items.  Do not share dishes, glasses, or other household items with the patient Avoid sharing household items. You should not share dishes, drinking glasses, cups, eating utensils, towels, bedding, or other items with a patient who is confirmed to have, or being evaluated for, COVID-19 infection. After the person uses these items, you should wash them thoroughly with soap and water.  Wash laundry thoroughly Immediately remove and wash clothes or bedding that have blood, body fluids, and/or secretions or excretions, such as sweat, saliva, sputum, nasal mucus, vomit, urine, or feces, on them. Wear gloves when handling laundry from the patient. Read and follow directions on labels of laundry or clothing items and detergent. In general, wash and dry with the warmest temperatures recommended on the label.  Clean all areas the individual has used often Clean all touchable surfaces, such as counters, tabletops, doorknobs, bathroom fixtures, toilets, phones, keyboards, tablets, and bedside tables, every day. Also, clean any surfaces that may have blood, body fluids, and/or secretions or excretions on them. Wear gloves when cleaning surfaces the patient has come in contact with. Use a diluted bleach solution (e.g., dilute bleach with 1 part bleach and 10 parts water) or a household disinfectant with a label that says EPA-registered for coronaviruses. To make  a bleach solution at home, add 1 tablespoon of bleach to 1 quart (4 cups) of water. For a larger supply, add  cup of bleach to 1 gallon (16 cups) of water. Read labels of cleaning products and follow recommendations provided on product labels. Labels contain instructions for safe and effective use of the cleaning product including precautions you should take when applying the product, such as wearing gloves or eye protection and making sure you  have good ventilation during use of the product. Remove gloves and wash hands immediately after cleaning.  Monitor yourself for signs and symptoms of illness Caregivers and household members are considered close contacts, should monitor their health, and will be asked to limit movement outside of the home to the extent possible. Follow the monitoring steps for close contacts listed on the symptom monitoring form.   ? If you have additional questions, contact your local health department or call the epidemiologist on call at [phone] (available 24/7). ? This guidance is subject to change. For the most up-to-date guidance from [facility] [place], please refer to their website: [email or web address]  FACT SHEET FOR PATIENTS, PARENTS, AND CAREGIVERS EMERGENCY USE AUTHORIZATION (EUA) OF PAXLOVID  FOR CORONAVIRUS DISEASE 2019 (COVID-19) You are being given this Fact Sheet because your healthcare provider believes it is  necessary to provide you with PAXLOVID  for the treatment of mild-to-moderate  coronavirus disease (COVID-19) caused by the SARS-CoV-2 virus. This Fact Sheet  contains information to help you understand the risks and benefits of taking the  PAXLOVID  you may receive. This Fact Sheet also contains information about how to  take PAXLOVID  and how to report side effects or problems with the appearance or  packaging of PAXLOVID . The U.S. Food and Drug Administration (FDA) has issued an Emergency Use  Authorization (EUA) to make PAXLOVID  available for the treatment of mild-to-moderate  COVID-19 in children 43 years of age and older weighing at least 88 pounds (40 kg)  who are at high risk for progression to severe COVID-19, including hospitalization or  death (for more details about an EUA please see "What is an Emergency Use  Authorization?" at the end of this document). Read this Fact Sheet for information  about PAXLOVID . Talk to your  healthcare provider about your options or if you have  any questions. It is your choice to take PAXLOVID . What is COVID-19? COVID-19 is caused by a virus called a coronavirus. You can get COVID-19 through  close contact with another person who has the virus.  COVID-19 illnesses have ranged from very mild to severe, including illness resulting in  death. While information so far suggests that most COVID-19 illness is mild, serious  illness can happen and may cause some of your other medical conditions to become  worse. Older people and people of all ages with severe, long lasting (chronic) medical  conditions like heart disease, lung disease, and diabetes, for example seem to be at  higher risk of being hospitalized for COVID-19.  What is PAXLOVID ? PAXLOVID  is a medicine that is available under EUA for the treatment of  mild-to-moderate COVID-19 in children 45 years of age and older weighing at least  88 pounds (40 kg) who are at high risk for progression to severe COVID-19, including  hospitalization or death. PAXLOVID  is FDA-approved for the treatment of COVID-19 in certain adults (see  section What other treatment choices are there?). PAXLOVID  is not FDA-approved or available under EUA for use in children younger  than 74 years of age or weighing  less than 88 pounds (40 kg). There is limited information about the safety and effectiveness of using PAXLOVID  to  treat children younger than 5 years of age or weighing less than 88 pounds (40 kg) with mild-to-moderate COVID-19. 2 Revised: 10/2023 What is the most important information I should know about PAXLOVID ?  PAXLOVID  can interact with other medicines causing severe or life-threatening side  effects or death. It is important to know the medicines that should not be taken with  PAXLOVID . Do not take PAXLOVID  if:  you are taking any of the following medicines: o alfuzosin o amiodarone o apalutamide o carbamazepine o colchicine o  dihydroergotamine o dronedarone o eletriptan o eplerenone o ergotamine o finerenone o flecainide o flibanserin o ivabradine o lomitapide o lovastatin o lumacaftor/ivacaftor o lurasidone o methylergonovine o midazolam (oral) o naloxegol o phenobarbital o phenytoin o pimozide o primidone o propafenone o quinidine o ranolazine o rifampin o rifapentine o [employer]. John's Wort  (hypericum perforatum) o sildenafil (Revatio) for  pulmonary arterial  hypertension o silodosin o simvastatin o tolvaptan o triazolam o ubrogepant o voclosporin These are not the only medicines that may cause serious or life-threatening side  effects if taken with PAXLOVID . PAXLOVID  may increase or decrease the levels of  multiple other medicines. It is very important to tell your healthcare provider about all  of the medicines you are taking because additional laboratory tests or changes in the  dose of your other medicines may be necessary during treatment with PAXLOVID .  Your healthcare provider may also tell you about specific symptoms to watch out for  that may indicate that you need to stop or decrease the dose of some of your other  medicines.  you are allergic to nirmatrelvir , ritonavir , or any of the ingredients in PAXLOVID .  See the end of this leaflet for a complete list of ingredients in PAXLOVID . See  "What are the important possible side effects of PAXLOVID ?" for signs and  symptoms of allergic reactions.  What should I tell my healthcare provider before I take PAXLOVID ? Tell your healthcare provider if you:  have kidney problems. You may need a different dose of PAXLOVID .  have liver problems, including hepatitis.  have Human Immunodeficiency Virus 1 (HIV-1) infection. PAXLOVID  may lead  to some HIV-1 medicines not working as well in the future. 3 Revised: 10/2023  are pregnant or plan to become pregnant. It is not known if PAXLOVID  can harm  your unborn baby. Tell your healthcare  provider right away if you are or if you  become pregnant.  are breastfeeding or plan to breastfeed. PAXLOVID  can pass into your breast  milk. Talk to your healthcare provider about the best way to feed your baby  during treatment with PAXLOVID .  Some medicines may interact with PAXLOVID  and may cause serious side  effects.   Tell your healthcare provider about all the medicines you take, including  prescription and over-the-counter medicines, vitamins, and herbal supplements.   Your healthcare provider can tell you if it is safe to take PAXLOVID  with other  medicines.  You can ask your healthcare provider or pharmacist for a list of medicines that  interact with PAXLOVID .  Do not start taking a new medicine without telling your healthcare provider. Tell your healthcare provider if you are taking combined birth control (hormonal contraceptive). PAXLOVID  may affect how your hormonal contraceptives work.  Females who are able to become pregnant should use another effective alternative  form of contraception or an additional  barrier method of contraception during treatment  with PAXLOVID . Talk to your healthcare provider if you have any questions about  contraceptive methods that might be right for you. How do I take PAXLOVID ?  Take PAXLOVID  exactly as your healthcare provider tells you to take it.   PAXLOVID  consists of 2 medicines: nirmatrelvir  tablets and ritonavir  tablets. The 2 medicines are taken together 2 times each day for 5 days. o Nirmatrelvir  is an oval, pink tablet. o Ritonavir  is a white or off-white tablet.  You will receive a Dose Pack containing single-dose blister cards (containing  10 blister cards).  See Figure A below on how to take the PAXLOVID  Dose Pack you receive and  follow the instructions on how to take it correctly.  If you have kidney disease, your healthcare provider may prescribe a lower  dose (see Figure A). Talk to your healthcare provider to make sure  you  receive the correct Dose Pack.  4 Revised: 10/2023 Figure A (Single-dose Blister Card) If you are prescribed PAXLOVID  300 mg; 100 mg Dose Pack:  each dose contains 3 tablets. How to take PAXLOVID  300 mg; 100 mg Dose Pack Morning Dose: Take the 2 pink nirmatrelvir  tablets and 1 white to off-white ritonavir  tablet together. Bedtime Dose: Take the 2 pink nirmatrelvir  tablets and 1 white to off-white ritonavir  tablet together. 5 Revised: 10/2023 Figure A (Single-dose Blister Card) If you are prescribed PAXLOVID  150 mg; 100 mg Dose Pack:  each dose contains 2 tablets. How to take PAXLOVID  150 mg; 100 mg Dose Pack Morning Dose:  Take the 1 pink nirmatrelvir  tablet and 1 white to off-white ritonavir  tablet together. Bedtime Dose: Take the 1 pink nirmatrelvir  tablet and 1 white to off-white ritonavir  tablet together. 6 Revised: 10/2023  Do not remove your PAXLOVID  tablets from the blister card before you are  ready to take your dose. o Take your first dose of PAXLOVID  in the morning or evening, depending  on when you pick up your prescription, or as your healthcare provider tells  you to.  Swallow the tablets whole. Do not chew, break, or crush the tablets.  Take PAXLOVID  with or without food.   Do not stop taking PAXLOVID  without talking to your healthcare provider, even if  you feel better.  If you miss a dose of PAXLOVID  within 8 hours of the time it is usually taken,  take it as soon as you remember. If you miss a dose by more than 8 hours, skip  the missed dose and take the next dose at your regular time. Do not take  2 doses of PAXLOVID  at the same time.   If you take too much PAXLOVID , call your healthcare provider or go to the  nearest hospital emergency room right away.  If you are taking a ritonavir - or cobicistat-containing medicine to treat hepatitis C  or HIV-1 infection, you should continue to take your medicine as prescribed by  your healthcare provider.  Talk  to your healthcare provider if you do not feel better or if you feel worse after  5 days. What are the important possible side effects of PAXLOVID ? PAXLOVID  may cause serious side effects, including:  Allergic reactions, including severe allergic reactions (anaphylaxis) have  happened during treatment with PAXLOVID . Stop taking PAXLOVID  and get  medical help right away if you get any of the following symptoms of an allergic  reaction: o skin rash, hives, blisters or peeling skin o painful sores or ulcers in the mouth,  nose, throat or genital area o swelling of the mouth, lips, tongue or  face o trouble swallowing or breathing o throat tightness o hoarseness  Liver Problems. Tell your healthcare provider right away if you get any of the  following signs and symptoms of liver problems during treatment with  PAXLOVID :  o loss of appetite o yellowing of your skin and the white of  eyes  o dark-colored urine o pale colored stools o itchy skin o stomach-area (abdominal) pain The most common side effects of PAXLOVID  include: altered sense of taste and  diarrhea. 7 Revised: 10/2023 Other possible side effects include:   headache  vomiting  abdominal pain  nausea  high blood pressure   feeling generally unwell These are not all of the possible side effects of PAXLOVID . For more information, ask  your healthcare provider or pharmacist.  What other treatment choices are there? PAXLOVID  is FDA-approved for the treatment of mild-to-moderate COVID-19 in certain  adults.  VEKLURY (remdesivir) is FDA-approved for the treatment of mild-to-moderate  COVID-19 in certain adults and children. Talk with your healthcare provider to see if  [facility] is appropriate for you. For information on the emergency use of other medicines that are authorized by FDA to  treat people with COVID-19, please go to  [email or web address]-booker.info/. Your healthcare provider may talk with you about clinical trials for  which you may be eligible.  It is your choice to be treated or not to be treated with PAXLOVID . Should you decide  not to receive it or for your child not to receive it, it will not change your standard  medical care. What if I am pregnant or breastfeeding? There is limited experience treating pregnant women or breastfeeding mothers with  PAXLOVID . For a mother and unborn baby, the benefit of taking PAXLOVID  may be  greater than the risk from the treatment. If you are pregnant, discuss your options and  specific situation with your healthcare provider. If you are breastfeeding, discuss your options and specific situation with your  healthcare provider. How do I report side effects or problems with the appearance or packaging of PAXLOVID ?  Contact your healthcare provider if you have any side effects that bother you or do not  go away.  Report side effects or problems with the appearance or packaging of PAXLOVID  (see  Figure A above for examples of PAXLOVID  Dose Packs) to FDA MedWatch at  8 Revised: 10/2023 [email or web address] or call 1-800-FDA-1088 or you can report side effects to [employer] at the contact information provided below. Website Fax number Telephone number [email or web address] [phone] How should I store PAXLOVID ? Store PAXLOVID  tablets at room temperature, between 68?F to 77?F (20?C to 25?C). Keep PAXLOVID  and all medicines out of the reach of children. How can I learn more about COVID-19?   Ask your healthcare provider.  Visit [email or web address]  Contact your local or state public health department. What is an Emergency Use Authorization (EUA)? The [place]  FDA has made PAXLOVID  available under an emergency access  mechanism  called an Emergency Use Authorization (EUA). The EUA is supported by a  Office Manager (HHS) declaration that circumstances exist to  justify the emergency use of drugs and biological products during the COVID-19  pandemic.  In issuing an EUA, the FDA has determined, among other things, that based on the  total amount of scientific evidence available including data from adequate and  well-controlled clinical trials, if available, it is reasonable to  believe that the product may  be effective for diagnosing, treating, or preventing COVID-19, or a serious or  life-threatening disease or condition caused by COVID-19; that the known and potential  benefits of the product, when used to diagnose, treat, or prevent such disease or  condition, outweigh the known and potential risks of such product; and that there are no  adequate, approved, and available alternatives. All of these criteria must be met to allow for the product to be available under an EUA.  The EUA for PAXLOVID  is in effect for the duration of the COVID-19 declaration  justifying emergency use of this product, unless the relevant EUA declaration is terminated or the EUA revoked (after which the products may no longer be used under  the EUA). What are the ingredients in PAXLOVID ? Active ingredient: nirmatrelvir  and ritonavir  Nirmatrelvir  inactive ingredients: colloidal silicon dioxide, croscarmellose sodium,  lactose monohydrate, microcrystalline cellulose, and sodium stearyl fumarate.  Film-coating contains: hydroxy propyl methylcellulose, iron oxide red, polyethylene  glycol, and titanium dioxide. 9 Revised: 10/2023 Ritonavir  inactive ingredients: anhydrous dibasic calcium phosphate, colloidal silicon  dioxide, copovidone, sodium stearyl fumarate, and sorbitan monolaurate. The film  coating may contain: colloidal anhydrous silica, colloidal silicon dioxide, hydroxypropyl  cellulose, hypromellose,  polyethylene glycol, polysorbate 80, talc, and titanium dioxide. Additional Information For general questions, visit the website or call the telephone number provided below.  Website Telephone number [email or web address] [phone] ([phone]) [phone] Revised: 10/2023 [email or web address]

## 2023-12-20 NOTE — Progress Notes (Signed)
 Subjective:    Patient ID: Sonya Trujillo, female    DOB: 08-19-1976, 48 y.o.   MRN: [id number]  47y/o established caucasian female here for evaluation per her supervisor.  Had fever at home yesterday so called out sick from work; fever resolved overnight.  Feeling better wearing cloth mask at work today.  Symptoms first started Saturday headache 12/17/23  Home covid test negative on Saturday per patient has not retested.  Currently only taking her blood pressure medication and dayquil OTC for runny nose      Review of Systems  Constitutional:  Positive for appetite change, fatigue and fever. Negative for chills.  HENT:  Positive for postnasal drip, rhinorrhea, sinus pressure and sinus pain. Negative for congestion, dental problem, drooling, ear discharge, ear pain, facial swelling, hearing loss, mouth sores, nosebleeds, tinnitus, trouble swallowing and voice change.   Eyes:  Negative for photophobia and visual disturbance.  Respiratory:  Negative for cough, shortness of breath, wheezing and stridor.   Cardiovascular:  Negative for chest pain and leg swelling.  Gastrointestinal:  Negative for diarrhea, nausea and vomiting.  Genitourinary:  Negative for difficulty urinating.  Musculoskeletal:  Negative for gait problem, neck pain and neck stiffness.  Skin:  Negative for color change and rash.  Neurological:  Positive for headaches. Negative for dizziness, tremors, seizures, syncope, facial asymmetry, speech difficulty, weakness, light-headedness and numbness.  Hematological:  Negative for adenopathy. Does not bruise/bleed easily.  Psychiatric/Behavioral:  Negative for agitation and confusion.        Objective:   Physical Exam Vitals reviewed.  Constitutional:      General: She is awake. She is not in acute distress.    Appearance: Normal appearance. She is well-developed and well-groomed. She is obese. She is not ill-appearing, toxic-appearing or diaphoretic.  HENT:     Head:  Normocephalic and atraumatic.     Jaw: There is normal jaw occlusion.     Salivary Glands: Right salivary gland is not diffusely enlarged or tender. Left salivary gland is not diffusely enlarged or tender.     Right Ear: Hearing and external ear normal. No decreased hearing noted. No laceration, drainage, swelling or tenderness. A middle ear effusion is present. There is no impacted cerumen. No foreign body. No mastoid tenderness. No PE tube. No hemotympanum. Tympanic membrane is not injected, scarred, perforated, erythematous, retracted or bulging.     Left Ear: Hearing and external ear normal. No decreased hearing noted. No laceration, drainage, swelling or tenderness. A middle ear effusion is present. There is no impacted cerumen. No foreign body. No mastoid tenderness. No PE tube. No hemotympanum. Tympanic membrane is not injected, scarred, perforated, erythematous, retracted or bulging.     Ears:     Comments: Bilateral TMs intact air fluid level clear    Nose: Nose normal. No congestion or rhinorrhea.     Right Nostril: No epistaxis.     Left Nostril: No epistaxis.     Right Turbinates: Enlarged and swollen. Not pale.     Left Turbinates: Enlarged and swollen. Not pale.     Right Sinus: No maxillary sinus tenderness or frontal sinus tenderness.     Left Sinus: No maxillary sinus tenderness or frontal sinus tenderness.     Comments: Bilateral nasal turbinates edema erythema clear discharge; bilateral allergic shiners;     Mouth/Throat:     Lips: Pink. No lesions.     Mouth: Mucous membranes are moist. No oral lesions or angioedema.     Dentition: No  gum lesions.     Tongue: No lesions. Tongue does not deviate from midline.     Palate: No mass and lesions.     Pharynx: Uvula midline. Pharyngeal swelling and postnasal drip present. No oropharyngeal exudate, posterior oropharyngeal erythema or uvula swelling.     Tonsils: No tonsillar exudate.     Comments: Cobblestoning posterior pharynx;   Eyes:     General: Lids are normal. Vision grossly intact. Gaze aligned appropriately. Allergic shiner present. No scleral icterus.       Right eye: No discharge.        Left eye: No discharge.     Extraocular Movements: Extraocular movements intact.     Conjunctiva/sclera: Conjunctivae normal.     Pupils: Pupils are equal, round, and reactive to light.  Neck:     Trachea: Trachea and phonation normal.  Cardiovascular:     Rate and Rhythm: Normal rate and regular rhythm.     Pulses: Normal pulses.          Radial pulses are 2+ on the right side and 2+ on the left side.     Heart sounds: Normal heart sounds, S1 normal and S2 normal.  Pulmonary:     Effort: Pulmonary effort is normal.     Breath sounds: Normal breath sounds and air entry. No stridor, decreased air movement or transmitted upper airway sounds. No decreased breath sounds, wheezing or rhonchi.     Comments: Spoke full sentences without difficulty; no cough observed in exam room Abdominal:     General: Abdomen is flat.  Musculoskeletal:        General: Normal range of motion.     Right hand: Normal strength. Normal capillary refill.     Left hand: Normal strength. Normal capillary refill.     Cervical back: Normal range of motion and neck supple. No swelling, edema, deformity, erythema, signs of trauma, lacerations, rigidity, spasms, torticollis, tenderness or crepitus. No pain with movement or muscular tenderness. Normal range of motion.     Thoracic back: No swelling, edema, deformity, signs of trauma, lacerations, spasms or tenderness. Normal range of motion.     Right lower leg: No edema.     Left lower leg: No edema.  Lymphadenopathy:     Head:     Right side of head: No submental, submandibular, tonsillar, preauricular, posterior auricular or occipital adenopathy.     Left side of head: No submental, submandibular, tonsillar, preauricular, posterior auricular or occipital adenopathy.     Cervical: No cervical  adenopathy.     Right cervical: No superficial, deep or posterior cervical adenopathy.    Left cervical: No superficial, deep or posterior cervical adenopathy.  Skin:    General: Skin is warm and dry.     Capillary Refill: Capillary refill takes less than 2 seconds.     Coloration: Skin is not ashen, cyanotic, jaundiced, mottled, pale or sallow.     Findings: No abrasion, abscess, acne, bruising, burn, ecchymosis, erythema, signs of injury, laceration, lesion, petechiae, rash or wound.     Nails: There is no clubbing.  Neurological:     General: No focal deficit present.     Mental Status: She is alert and oriented to person, place, and time. Mental status is at baseline.     GCS: GCS eye subscore is 4. GCS verbal subscore is 5. GCS motor subscore is 6.     Cranial Nerves: Cranial nerves 2-12 are intact. No cranial nerve deficit, dysarthria or facial  asymmetry.     Sensory: Sensation is intact.     Motor: Motor function is intact. No weakness, tremor, atrophy, abnormal muscle tone or seizure activity.     Coordination: Coordination is intact. Coordination normal.     Gait: Gait is intact. Gait normal.     Comments: In/out of chair without difficulty; gait sure and steady in clinic; bilateral hand grasp equal 5/5  Psychiatric:        Attention and Perception: Attention and perception normal.        Mood and Affect: Mood and affect normal.        Speech: Speech normal.        Behavior: Behavior normal. Behavior is cooperative.        Thought Content: Thought content normal.        Cognition and Memory: Cognition and memory normal.        Judgment: Judgment normal.      Covid test positive at 5 minutes  Day 0 12/20/23  symptoms 12/17/23 fever, headache, cough and congestion/head cold home covid test was negative at this time.  Denied known sick contacts.  Denied close contacts in previous 48hours at work e.g. no mask greater than 15 minutes within 6 feet face to face contact.  But has had  close contact with others at home/live with her.  Discussed recommend they covid test today and again on day 6 if asymptomatic.  If having symptoms and first test negative repeat again in 48 hours until 3 negative tests.  Pt began quarantine at that time. Patient did not develop symptoms of  trouble breathing, chest pain, nausea, vomiting, diarrhea.   quarantine per CDC recommendations until fever free 24 hours off antipyretics and symptoms improving. Day 1 of quarantine 12/21/23. Patient to contact clinic staff if vomiting after coughing or unable to tolerate po fluids.  Discussed flu and other viral illnesses circulating in community and some causing GI upset.  If GI upset I have recommended clear fluids then bland diet.  Avoid dairy/spicy, fried and large portions of meat while having nausea.  If vomiting hold po intake x 1 hour.  Then sips clear fluids like broths, ginger ale, power ade, gatorade, pedialyte may advance to soft/bland if no vomiting x 24 hours and appetite returned otherwise hydration main focus. Call me at work from home number if symptoms not improved with plan of care  patient to call if high fever, dehydration, marked weakness, fainting, increased abdominal pain, blood in stool or vomit (red or black).     Reviewed possible Covid symptoms including cough, shortness of breath with exertion or at rest, runny nose, congestion, sinus pain/pressure, sore throat, fever/chills, body aches, fatigue, loss of taste/smell, GI symptoms of nausea/vomiting/diarrhea. Also reviewed same day/emergent eval/ER precautions of dizziness/syncope, confusion, blue tint to lips/face, severe shortness of breath/difficulty breathing/wheezing.    Patient to isolate in own room and if possible use only one bathroom if living with others in home.  Wear mask when out of room to help prevent spread to others in household.  Sanitize high touch surfaces with lysol/chlorox/bleach spray or wipes daily as viruses are known  to live on surfaces from 24 hours to days.  Patient does want antivirals.  Patient at higher risk for hospitalization due to obesity, hypertension, mental health, vitamin D  deficiency.  Patient is up to date on covid vaccines.  Patient is on prescription medications or daily medications. If taking medications epocrates interaction checker used to verify if any  drug interactions.  No interaction with paxlovid  noted.  taking OTC cough/cold/fever medication at this time dayquil/nyquil.  Patient paxlovid  emergency use handout sent to patient electronically along with covid quarantine exitcare handout in my chart.  Discussed how to take paxlovid  e.g. 3 pills 2 of nirmatrelvir  150mg  and 1 of ritonavir  100mg  am/pm x 5 days by mouth every 12 hours x 5 days #20/10 sent rx electronically to her pharmacy of choice and refill covid test UUD #4 RF1 rx sent to her pharmacy of choice. Discussed lemonade can sometimes help with metallic/plastic taste in mouth (side effect medication).  Discussed most common side effects GI upset and bad taste in mouth.  Use birth control/avoid getting pregnant while on paxlovid  and no breastfeeding.  Discussed I recommended not having sex with anyone while sick/testing positive/10 day quarantine as could spread virus to partner.  Discussed with patient I or RN Apolinar will call tomorrow for re-evaluation of symptoms/see if questions/concerns.  Reminder symptoms need to be improving and fever free off antipyretics before return to work with mask x 10 days through 12/30/23  Exitcare handouts on covid quarantine/home care sent to patient my chart.  Patient had used up all her dayquil.  It was working for her  May use salt water gargles and nasal saline 2 sprays each nostril q2h prn congestion/sore throat.  Research has shown it helps to prevent hospitalizations and decrease discomfort.   May use flonase  nasal 50mcg 1 spray each nostril BID prn rhinitis.  Dayquil and nyquil per manufacturer  instructions.  Discussed honey 1 tablespoon every 4 hours is a natural cough suppressant but caution due to his diabetes.  Avoid dehydration and drink water to keep urine pale yellow clear and voiding every 2-4 hours while awake.  Patient alert and oriented x3, spoke full sentences without difficulty. No nasal congestion/cough/throat clearing/hoarse voice/wheezing/shortness of breath in clinic today.  Discussed with patient can contact NP Ellouise through my chart/[phone] when clinic closed if questions or concerns until RN in clinic Wed/Th 08a-5p [phone].   Pt verbalized understanding and agreement with plan of care. No further questions/concerns at this time. Pt reminded to contact clinic with any changes in symptoms or questions/concerns. HR notified patient to work remote/quarantine until cleared to return onsite with mask and no eating in employee lunch room through 12/30/23.   Estimated return to work onsite 12/22/23 at this time  HR and Supervisor notified of excused absence for today and tomorrow.      Assessment & Plan:   A-viral URI with cough, lab test positive for covid  P-given 1 free [place]  govt home covid test to perform prior to reuturning to workcenter.  Discussed with patient covid/influenza/metapneumovirus/adenovirus/RSV circulating in high levels in community.  Typically lasting symptoms 10-14 days.  Mucous clear to yellow to green then done. Sp02 stable BBS CTA discussed no fluid or wheezing auscultated today e.g. pneumonia.  Honey 1 tablespoon po q4h prn cough.  Bromphed DM  30/2/10/52ml take 5ml po q6h prn cough/rhinitis #240 RF0 patient stated she plans to use this at bedtime and continue dayquil prn.   electronic Rx sent to her pharmacy of choice discussed may cause drowsiness take first dose at home.  Discussed has antihistamine/sudafed/cough suppressant.   Discussed need to dry up post nasal drip and will help with cough/sore throat also.  Discussed many viruses circulating in community.   Discussed hydrate with water to keep urine pale yellow clear and voiding every 2-4 hours while awake.  Patient may  use normal saline nasal spray 2 sprays each nostril q2h wa as needed given 1 bottle from clinic stock today.  Consider flonase  50mcg 1 spray each nostril BID OTC.  Sudafed may cause drowsiness/elevated heart rate/insomnia/med head.  Avoid driving until she knows how she responds to medication.  Discussed post nasal drip triggering cough need to get it dried up.   Avoid triggers if possible.  Shower prior to bedtime after work and am prn congestion Call or return to clinic as needed if these symptoms worsen or fail to improve as anticipated e.g. brown opaque mucous/bloody mucous/chunky mucous, wheezing or dyspnea develop.   Exitcare handouts viral URI with cough, and sinus rinse sent to patient my chart.  Patient verbalized understanding of instructions, agreed with plan of care and had no further questions at this time.

## 2023-12-21 ENCOUNTER — Telehealth: Payer: Self-pay

## 2024-01-04 ENCOUNTER — Inpatient Hospital Stay: Admission: RE | Admit: 2024-01-04 | Payer: No Typology Code available for payment source | Source: Ambulatory Visit

## 2024-01-13 ENCOUNTER — Ambulatory Visit
Payer: No Typology Code available for payment source | Attending: Cardiovascular Disease | Admitting: Cardiovascular Disease

## 2024-01-13 ENCOUNTER — Other Ambulatory Visit (HOSPITAL_COMMUNITY): Payer: MEDICAID | Attending: Medical Genetics
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
## 2024-02-03 ENCOUNTER — Ambulatory Visit: Payer: No Typology Code available for payment source | Admitting: Family Medicine

## 2024-02-14 ENCOUNTER — Telehealth: Payer: Self-pay | Admitting: Registered Nurse

## 2024-02-14 DIAGNOSIS — Z0000 Encounter for general adult medical examination without abnormal findings: Secondary | ICD-10-CM

## 2024-02-15 ENCOUNTER — Encounter: Payer: No Typology Code available for payment source | Admitting: Obstetrics and Gynecology

## 2024-02-16 ENCOUNTER — Other Ambulatory Visit

## 2024-03-04 ENCOUNTER — Encounter: Payer: Self-pay | Admitting: Registered Nurse

## 2024-03-04 ENCOUNTER — Telehealth: Payer: Self-pay | Admitting: Registered Nurse

## 2024-03-04 DIAGNOSIS — R110 Nausea: Secondary | ICD-10-CM

## 2024-03-04 NOTE — Telephone Encounter (Signed)
 Spoke with patient via telephone 03/03/24 stated had nausea and almost threw up at work so left early notified supervisor not feeling well.  Denied diarrhea/fever/chills/dizziness.  Feeling better today denied vomiting/fever/diarrhea today.  Discussed rx for zofran patient refused.  Patient A&Ox3 spoke full sentences without difficulty.   I have recommended clear fluids and bland diet.  Avoid dairy/spicy, fried and large portions of meat while having nausea.  If vomiting hold po intake x 1 hour.  Then sips clear fluids like broths, ginger ale, power ade, gatorade, pedialyte may advance to soft/bland if no vomiting x 24 hours and appetite returned otherwise hydration main focus.  Notify NP if symptoms persist or worsen; I have alerted the patient to call if high fever, dehydration, marked weakness, fainting, increased abdominal pain, blood in stool or vomit (red or black).   Exitcare handout on nausea/vomiting. HR and supervisor notified excused absence 03/02/24 for communicable disease.  Patient verbalized agreement and understanding of treatment plan and had no further questions at this time.

## 2024-03-05 ENCOUNTER — Other Ambulatory Visit: Payer: MEDICAID
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
## 2024-03-14 ENCOUNTER — Other Ambulatory Visit: Payer: MEDICAID

## 2024-03-14 ENCOUNTER — Other Ambulatory Visit: Payer: Self-pay

## 2024-03-14 VITALS — BP 153/93 | Ht 66.0 in | Wt 255.0 lb

## 2024-03-14 DIAGNOSIS — Z0000 Encounter for general adult medical examination without abnormal findings: Secondary | ICD-10-CM

## 2024-03-14 NOTE — Progress Notes (Unsigned)
 Exec panel and HgbA1C

## 2024-03-14 NOTE — Addendum Note (Signed)
 Addended by: Heloise Ochoa A on: 03/14/2024 10:25 AM   Modules accepted: Orders

## 2024-03-14 NOTE — Progress Notes (Signed)
 Exec panel and HgbA1C

## 2024-03-15 ENCOUNTER — Encounter: Payer: Self-pay | Admitting: Registered Nurse

## 2024-03-15 LAB — CMP12+LP+TP+TSH+6AC+CBC/D/PLT
ALT: 17 IU/L (ref 0–32)
AST: 15 IU/L (ref 0–40)
Albumin: 3.9 g/dL (ref 3.9–4.9)
Alkaline Phosphatase: 49 IU/L (ref 44–121)
BUN/Creatinine Ratio: 13 (ref 9–23)
BUN: 10 mg/dL (ref 6–24)
Basophils Absolute: 0.1 10*3/uL (ref 0.0–0.2)
Basos: 1 %
Bilirubin Total: 0.4 mg/dL (ref 0.0–1.2)
Calcium: 9.3 mg/dL (ref 8.7–10.2)
Chloride: 101 mmol/L (ref 96–106)
Chol/HDL Ratio: 3.6 ratio (ref 0.0–4.4)
Cholesterol, Total: 174 mg/dL (ref 100–199)
Creatinine, Ser: 0.75 mg/dL (ref 0.57–1.00)
EOS (ABSOLUTE): 0.3 10*3/uL (ref 0.0–0.4)
Eos: 5 %
Estimated CHD Risk: 0.6 times avg. (ref 0.0–1.0)
Free Thyroxine Index: 1.8 (ref 1.2–4.9)
GGT: 6 IU/L (ref 0–60)
Globulin, Total: 2.4 g/dL (ref 1.5–4.5)
Glucose: 100 mg/dL — ABNORMAL HIGH (ref 70–99)
HDL: 48 mg/dL (ref >39)
Hematocrit: 40.3 % (ref 34.0–46.6)
Hemoglobin: 13.3 g/dL (ref 11.1–15.9)
Immature Grans (Abs): 0 10*3/uL (ref 0.0–0.1)
Immature Granulocytes: 0 %
Iron: 88 ug/dL (ref 27–159)
LDH: 169 IU/L (ref 119–226)
LDL Chol Calc (NIH): 111 mg/dL — ABNORMAL HIGH (ref 0–99)
Lymphocytes Absolute: 1.9 10*3/uL (ref 0.7–3.1)
Lymphs: 29 %
MCH: 31.1 pg (ref 26.6–33.0)
MCHC: 33 g/dL (ref 31.5–35.7)
MCV: 94 fL (ref 79–97)
Monocytes Absolute: 0.6 10*3/uL (ref 0.1–0.9)
Monocytes: 10 %
Neutrophils Absolute: 3.7 10*3/uL (ref 1.4–7.0)
Neutrophils: 55 %
Phosphorus: 3.4 mg/dL (ref 3.0–4.3)
Platelets: 212 10*3/uL (ref 150–450)
Potassium: 4.2 mmol/L (ref 3.5–5.2)
RBC: 4.27 x10E6/uL (ref 3.77–5.28)
RDW: 11.8 % (ref 11.7–15.4)
Sodium: 138 mmol/L (ref 134–144)
T3 Uptake Ratio: 27 % (ref 24–39)
T4, Total: 6.7 ug/dL (ref 4.5–12.0)
TSH: 2.11 u[IU]/mL (ref 0.450–4.500)
Total Protein: 6.3 g/dL (ref 6.0–8.5)
Triglycerides: 81 mg/dL (ref 0–149)
Uric Acid: 4 mg/dL (ref 2.6–6.2)
VLDL Cholesterol Cal: 15 mg/dL (ref 5–40)
WBC: 6.7 10*3/uL (ref 3.4–10.8)
eGFR: 99 mL/min/{1.73_m2} (ref >59)

## 2024-03-15 LAB — HEMOGLOBIN A1C
Est. average glucose Bld gHb Est-mCnc: 114 mg/dL
Hgb A1c MFr Bld: 5.6 % (ref 4.8–5.6)

## 2024-03-18 NOTE — Telephone Encounter (Signed)
 Patient completed labs with RN Thersia Flax 03/14/24 BP 153/93 Important   (BP Location: Left Arm, Patient Position: Sitting, Cuff Size: Normal)   Ht 5\' 6"  (1.676 m)   Wt 255 lb (115.7 kg)   BMI 41.16 kg/m   BSA 2.32 m  BP did not meet Be Well requirements less than 135/85 LDL was less than 130 and [id number] less than 7  Latest Reference Range & Units 03/14/24 08:00  Sodium 134 - 144 mmol/L 138  Potassium 3.5 - 5.2 mmol/L 4.2  Chloride 96 - 106 mmol/L 101  Glucose 70 - 99 mg/dL 045 (H)  BUN 6 - 24 mg/dL 10  Creatinine 4.09 - 8.11 mg/dL 9.14  Calcium 8.7 - 78.2 mg/dL 9.3  BUN/Creatinine Ratio 9 - 23  13  eGFR >59 mL/min/1.73 99  Phosphorus 3.0 - 4.3 mg/dL 3.4  Alkaline Phosphatase 44 - 121 IU/L 49  Albumin 3.9 - 4.9 g/dL 3.9  Uric Acid 2.6 - 6.2 mg/dL 4.0  AST 0 - 40 IU/L 15  ALT 0 - 32 IU/L 17  Total Protein 6.0 - 8.5 g/dL 6.3  Total Bilirubin 0.0 - 1.2 mg/dL 0.4  GGT 0 - 60 IU/L 6  Estimated CHD Risk 0.0 - 1.0 times avg. 0.6  LDH 119 - 226 IU/L 169  Total CHOL/HDL Ratio 0.0 - 4.4 ratio 3.6  Cholesterol, Total 100 - 199 mg/dL 956  HDL Cholesterol >21 mg/dL 48  Triglycerides 0 - 308 mg/dL 81  VLDL Cholesterol Cal 5 - 40 mg/dL 15  LDL Chol Calc (NIH) 0 - 99 mg/dL 657 (H)  Iron 27 - 846 ug/dL 88  Globulin, Total 1.5 - 4.5 g/dL 2.4  WBC 3.4 - 96.2 X52W4/XL 6.7  RBC 3.77 - 5.28 x10E6/uL 4.27  Hemoglobin 11.1 - 15.9 g/dL 24.4  HCT 01.0 - 27.2 % 40.3  MCV 79 - 97 fL 94  MCH 26.6 - 33.0 pg 31.1  MCHC 31.5 - 35.7 g/dL 53.6  RDW 64.4 - 03.4 % 11.8  Platelets 150 - 450 x10E3/uL 212  Neutrophils Not Estab. % 55  Immature Granulocytes Not Estab. % 0  NEUT# 1.4 - 7.0 x10E3/uL 3.7  Lymphs Abs 0.7 - 3.1 x10E3/uL 1.9  Monocytes Absolute 0.1 - 0.9 x10E3/uL 0.6  Basophils Absolute 0.0 - 0.2 x10E3/uL 0.1  Immature Grans (Abs) 0.0 - 0.1 x10E3/uL 0.0  Lymphs Not Estab. % 29  Monocytes Not Estab. % 10  Basos Not Estab. % 1  Eos Not Estab. % 5  EOS (ABSOLUTE) 0.0 - 0.4 x10E3/uL 0.3   Hemoglobin A1C 4.8 - 5.6 % 5.6  Est. average glucose Bld gHb Est-mCnc mg/dL 742  (H): Data is abnormally high

## 2024-03-21 NOTE — Telephone Encounter (Signed)
 Patient met with RN Thersia Flax and discussed lab results in person 03/21/24.  Patient signed Be Well 2026 ROI and tobacco attestation no smoking or vaping in previous 12 months with RN Thersia Flax.  BP 153/93 weight 255lbs BMI 41.2 Hgba1c 5.6 LDL 111 met requirements for insurance discount starting 05 Sep 2024  NP signed Be Well paperwork and completed [facility] form given to HR Tonya 03/20/2024

## 2024-05-24 ENCOUNTER — Ambulatory Visit: Admitting: Registered Nurse

## 2024-05-25 ENCOUNTER — Encounter: Payer: Self-pay | Admitting: Registered Nurse

## 2024-05-25 MED ORDER — IBUPROFEN 800 MG PO TABS: 800.0000 mg | ORAL_TABLET | Freq: Three times a day (TID) | ORAL | Status: AC

## 2024-05-25 MED ORDER — ACETAMINOPHEN 500 MG PO TABS: 1000.0000 mg | ORAL_TABLET | Freq: Four times a day (QID) | ORAL | Status: AC | PRN

## 2024-05-25 NOTE — Progress Notes (Signed)
 Established Patient Office Visit  Subjective   Patient ID: Sonya Trujillo, female    DOB: 1976-11-05  Age: 48 y.o. MRN: [id number]  Chief Complaint  Patient presents with   Pain    Right hand pain/swelling    48y/o was shopping at grocery store yesterday and moved cart but right hand caught between cart and end cap display to allow another patron to pass by.  Pain/swelling improved with ice and was feeling better but then hit hand on her chair at work by accident today and worsening pain and swelling saw RN Thersia Flax prior to NP clinic start time was given ace wrap/ice.  Patient reported started to have sharp pain with moving fingers after banging hand on chair and that is why she went to clinic.  She denied hand weakness/bleeding/tingling.  That sensation has resolved and swelling reduced no longer wearing ace bandage.  Patient has been using left hand at work as much as possible to rest right hand yesterday and today   patient has ibuprofen and using prn       Review of Systems  Constitutional:  Negative for chills and fever.  Musculoskeletal:  Positive for myalgias. Negative for back pain, falls and neck pain.  Neurological:  Negative for dizziness, tingling, tremors, sensory change, speech change, focal weakness, weakness and headaches.  Endo/Heme/Allergies:  Does not bruise/bleed easily.      Objective:     There were no vitals taken for this visit.   BP 145/92  left arm sitting large cuff HR 49 99% RA Sp02 performed by RN Thersia Flax  Physical Exam Vitals and nursing note reviewed.  Constitutional:      General: She is awake. She is not in acute distress.    Appearance: Normal appearance. She is well-developed, well-groomed and overweight. She is not ill-appearing, toxic-appearing or diaphoretic.  HENT:     Head: Normocephalic and atraumatic.     Jaw: There is normal jaw occlusion.     Salivary Glands: Right salivary gland is not diffusely enlarged. Left salivary gland is not  diffusely enlarged.     Right Ear: Hearing and external ear normal.     Left Ear: Hearing and external ear normal.     Nose: Nose normal. No congestion or rhinorrhea.     Mouth/Throat:     Lips: Pink. No lesions.     Mouth: Mucous membranes are moist.     Pharynx: Oropharynx is clear.   Eyes:     General: Lids are normal. Vision grossly intact. Gaze aligned appropriately. No scleral icterus.       Right eye: No discharge.        Left eye: No discharge.     Extraocular Movements: Extraocular movements intact.     Conjunctiva/sclera: Conjunctivae normal.     Pupils: Pupils are equal, round, and reactive to light.   Neck:     Trachea: Trachea normal.   Cardiovascular:     Rate and Rhythm: Normal rate and regular rhythm.  Pulmonary:     Effort: Pulmonary effort is normal.     Breath sounds: Normal breath sounds and air entry. No stridor or transmitted upper airway sounds. No wheezing.     Comments: Spoke full sentences without difficulty; no cough observed in exam room Abdominal:     General: Abdomen is flat.   Musculoskeletal:        General: Normal range of motion.     Right elbow: No swelling, deformity, effusion or lacerations. Normal  range of motion.     Left elbow: No swelling, deformity, effusion or lacerations. Normal range of motion.     Right forearm: No swelling, edema, deformity, lacerations or tenderness.     Left forearm: No swelling, edema, deformity, lacerations or tenderness.     Right wrist: No swelling, deformity, effusion, lacerations, tenderness, bony tenderness, snuff box tenderness or crepitus. Normal range of motion.     Left wrist: No swelling, deformity, effusion, lacerations, tenderness, bony tenderness, snuff box tenderness or crepitus. Normal range of motion.     Right hand: Swelling and tenderness present. No deformity, lacerations or bony tenderness. Normal range of motion. Normal strength. Normal sensation. Normal capillary refill.     Left hand: No  swelling, deformity, lacerations, tenderness or bony tenderness. Normal range of motion. Normal strength. Normal sensation. Normal capillary refill.       Arms:       Hands:     Cervical back: Normal range of motion and neck supple.     Comments: Full AROM and equal bilaterally nonpitting edema 2+/4 localized dorsum right hand overlying MCP 3-4 and MCP 2-4 mildly TTP linear abrasion scabbed tan laterally ; ecchymosis central dorsum right hand and mild erythema; equal strength flexion/extension/adduction/abduction all fingers 5/5; pain with flexion of fingers to make fist but no crepitus audible or palpable and no palpable defects/step offs; no bruising/rash/abrasions/swelling palmar only dorsum  Lymphadenopathy:     Head:     Right side of head: No submandibular or preauricular adenopathy.     Left side of head: No submandibular or preauricular adenopathy.     Cervical:     Right cervical: No superficial cervical adenopathy.    Left cervical: No superficial cervical adenopathy.     Upper Body:     Right upper body: No supraclavicular adenopathy.     Left upper body: No supraclavicular adenopathy.   Skin:    General: Skin is warm and dry.     Capillary Refill: Capillary refill takes less than 2 seconds.     Coloration: Skin is not ashen, cyanotic, jaundiced, mottled, pale or sallow.     Findings: Abrasion, bruising, ecchymosis, erythema, signs of injury and rash present. No abscess, acne, burn, laceration, petechiae or wound. Rash is macular. Rash is not crusting, nodular, papular, purpuric, pustular, scaling, urticarial or vesicular.     Nails: There is no clubbing.     Comments: Dorsum right hand ecchymosis and erythema linear abrasion see MSK   Neurological:     General: No focal deficit present.     Mental Status: She is alert and oriented to person, place, and time. Mental status is at baseline.     GCS: GCS eye subscore is 4. GCS verbal subscore is 5. GCS motor subscore is 6.      Cranial Nerves: No cranial nerve deficit, dysarthria or facial asymmetry.     Sensory: Sensation is intact.     Motor: Motor function is intact. No weakness, tremor, atrophy, abnormal muscle tone or seizure activity.     Coordination: Coordination is intact. Coordination normal.     Gait: Gait is intact. Gait normal.     Comments: In/out of chair and on/off exam table without difficulty; gait sure and steady in clinic; bilateral hand grasp equal 5/5  Psychiatric:        Attention and Perception: Attention and perception normal.        Mood and Affect: Mood and affect normal.  Speech: Speech normal.        Behavior: Behavior normal. Behavior is cooperative.        Thought Content: Thought content normal.        Cognition and Memory: Cognition and memory normal.        Judgment: Judgment normal.      No results found for any visits on 05/24/24.    The 10-year ASCVD risk score (Arnett DK, et al., 2019) is: 1.8%    Assessment & Plan:   Problem List Items Addressed This Visit   None Visit Diagnoses       Contusion of dorsum of right hand    -  Primary     Discussed with patient contusion/abrasion and may have mild hematoma.  Discussed contusion/hematoma may take 1-3 weeks to complete heal discolor and swelling to resolve.  Should improve gradually each day not be spreading/worsening or streaking to fingers/arm.  If that occurs to seek re-evaluation with provider as possible infection.  Exitcare handouts contusion and hematoma.  Patient was instructed to rest hand on breaks/after work.  Consider icing 15 minutes on break and elevate at work.  If needs work restrictions will need to see PCM or UC as unable to provider work restrictions in this clinic and non work injury therefore not scheduling with [facility] provider.   Do not soak hand in dirty water until abrasions healed avoid pool, lake, hot tub, dirty sink water. May shower apply neosporin or triple antibiotic BID keep wounds covered  they will heal faster and prevent contamination rubbing from clothing tearing off scabs.  RN Thersia Flax had dispensed from clinic stock ace banadage to patient and roll of cobain for use at work today to help provide support and compression to inhibit further swelling.   daily washing with soap and water.  Change prn soiling. Exitcare handout on contusion, hematoma and abrasion given to patient.   May use tylenol  1000mg  po q6h prn pain and/or ibuprofen 800mg  po q8h take with food.  Medications as directed. Call or return to clinic as needed if these symptoms worsen or fail to improve as anticipated. Patient verbalized agreement and understanding of treatment plan. P2: ROM, injury prevention    Return if symptoms worsen or fail to improve.    Richardine Chancy, NP

## 2024-05-25 NOTE — Patient Instructions (Addendum)
 Hematoma A hematoma is a collection of blood under the skin, in an organ, in a body space, in a joint space, or in other tissue. The blood can thicken (clot) to form a lump that you can see and feel. The lump is often firm and may become sore and tender. Most hematomas get better in a few days to weeks. However, some hematomas may be serious and require medical care. Hematomas can range from very small to very large. What are the causes? This condition is caused by: A blunt or penetrating injury. Leakage from a blood vessel under the skin. Some medical procedures, including surgeries, such as oral surgery, face lifts, and surgeries on the joints. Some medical conditions that cause bleeding or bruising. There may be multiple hematomas that appear in different areas of the body. What increases the risk? You are more likely to develop this condition if: You are an older adult. You use blood thinners. You regularly use NSAIDs, such as ibuprofen, for pain. You play contact sports. What are the signs or symptoms?  Symptoms of this condition depend on where the hematoma is located.  Common symptoms of a hematoma that is under the skin include: A firm lump on the body. Pain and tenderness in the area. Bruising. Blue, dark blue, purple-red, or yellowish skin (discoloration) may appear at the site of the hematoma if the hematoma is close to the surface of the skin. Common symptoms of a hematoma that is deep in the tissues or body spaces may be less obvious. They include: A collection of blood in the stomach (intra-abdominal hematoma). This may cause pain in the abdomen, weakness, fainting, and shortness of breath. A collection of blood in the head (intracranial hematoma). This may cause a headache or symptoms such as weakness, trouble speaking or understanding, or a change in consciousness. How is this diagnosed? This condition is diagnosed based on: Your medical history. A physical exam. Imaging  tests, such as an ultrasound or CT scan. These may be needed if your health care provider suspects a hematoma in deeper tissues or body spaces. Blood tests. These may be needed if your health care provider believes that the hematoma is caused by a medical condition. How is this treated? Treatment for this condition depends on the cause, size, and location of the hematoma. Treatment may include: Doing nothing. The majority of hematomas do not need treatment as many of them go away on their own. Surgery or close monitoring. This may be needed for large hematomas or hematomas that affect vital organs. Medicines. Medicines may be given if there is an underlying medical cause for the hematoma. Follow these instructions at home: Managing pain, stiffness, and swelling  If directed, put ice on the injured area. To do this: Put ice in a plastic bag. Place a towel between your skin and the bag. Leave the ice on for 20 minutes, 2-3 times a day for the first couple of days. If your skin turns bright red, remove the ice right away to prevent skin damage. The risk of skin damage is higher if you cannot feel pain, heat, or cold. If directed, apply heat to the affected area as often as told by your health care provider. Use the heat source that your health care provider recommends, such as a moist heat pack or a heating pad. Place a towel between your skin and the heat source. Leave the heat on for 20-30 minutes. If your skin turns bright red, remove the heat right  away to prevent burns. The risk of burns is higher if you cannot feel pain, heat, or cold. Raise (elevate) the injured area above the level of your heart while you are sitting or lying down. If directed, wrap the affected area with an elastic bandage. The bandage applies pressure (compression) to the area, which may help to reduce swelling and promote healing. Do not wrap the bandage too tightly around the affected area. If your hematoma is on a leg  or foot (lower extremity) and is painful, your health care provider may recommend crutches. Use them as told by your health care provider. General instructions Take over-the-counter and prescription medicines only as told by your health care provider. Rest the injured area as directed by your health care provider. Keep all follow-up visits. Your health care provider may want to see how your hematoma is progressing with treatment. Contact a health care provider if: You have a fever. The swelling or discoloration gets worse. You develop more hematomas. Your pain is worse or your pain is not controlled with medicine. Your skin over the hematoma breaks or starts bleeding. Get help right away if: Your hematoma is in your chest or abdomen and you have weakness, shortness of breath, or a change in consciousness. You have a hematoma on your scalp that is caused by a fall or injury, and you also have: A headache that gets worse. Trouble speaking or understanding speech. Weakness. A change in alertness or consciousness. These symptoms may be an emergency. Get help right away. Call 911. Do not wait to see if the symptoms will go away. Do not drive yourself to the hospital. This information is not intended to replace advice given to you by your health care provider. Make sure you discuss any questions you have with your health care provider. Document Revised: 05/17/2022 Document Reviewed: 05/17/2022 Elsevier Patient Education  2024 [employer]Contusion A contusion is a deep bruise. Contusions are the result of a blunt injury to tissues and muscle fibers under the skin. The injury causes bleeding under the skin. The skin over the contusion may turn blue, purple, or yellow. Minor injuries will give you a painless contusion, but more severe injuries cause contusions that can stay painful and swollen for a few weeks. Follow these instructions at home: Pay attention to any changes in your symptoms. Let  your health care provider know about them. Take these actions to relieve your pain. Managing pain, stiffness, and swelling  Use resting, icing, applying pressure (compression), and raising (elevating) the injured area. This is often called the RICE method. Rest the injured area. Return to your normal activities as told by your health care provider. Ask your health care provider what activities are safe for you. If directed, put ice on the injured area. To do this: Put ice in a plastic bag. Place a towel between your skin and the bag. Leave the ice on for 20 minutes, 2-3 times a day. If your skin turns bright red, remove the ice right away to prevent skin damage. The risk of skin damage is higher if you cannot feel pain, heat, or cold. If directed, apply light compression to the injured area using an elastic bandage. Make sure the bandage is not wrapped too tightly. Remove and reapply the bandage as directed by your health care provider. If possible, elevate the injured area above the level of your heart while you are sitting or lying down. General instructions Take over-the-counter and prescription medicines only as told by  your health care provider. Keep all follow-up visits. Your health care provider may want to see how your contusion is healing with treatment. Contact a health care provider if: Your symptoms do not improve after several days of treatment. Your symptoms get worse. You have difficulty moving the injured area. Get help right away if: You have severe pain. You have numbness in a hand or foot. Your hand or foot turns pale or cold. This information is not intended to replace advice given to you by your health care provider. Make sure you discuss any questions you have with your health care provider. Document Revised: 05/10/2022 Document Reviewed: 05/10/2022 Elsevier Patient Education  2024 [employer]Abrasion An abrasion is a cut or scrape that affects only the surface of  the skin. The injury doesn't go through all the layers of the skin. Still, an abrasion can cause pain because it leaves the skin and its nerves open. Abrasions are usually minor injuries that can be treated at home. You must care for your abrasion to prevent infection. What are the causes? Abrasions happen when something rubs, scrapes, or scratches your skin. This can happen when you fall on a hard or rough surface. Or, when a bush in your yard scratches you. When your skin rubs against something, some layers of skin may come off. You may also see tiny tears on your skin. What are the signs or symptoms? The main symptom of this condition is a cut or a scrape. The cut or scrape may bleed. It may also appear red or pink. If your abrasion is caused by a fall, there may be a bruise under your cut or scrape. How is this diagnosed? An abrasion is diagnosed with a physical exam. How is this treated? Treatment for this condition depends on how large and deep the abrasion is. In most cases: Your abrasion will be cleaned with water and mild soap. An antibiotic may be put on the wound to prevent infection. A petroleum jelly may be put on the wound to prevent moisture. A bandage may be placed on your abrasion to keep it clean. You may need a tetanus shot. Follow these instructions at home: Medicines Take over-the-counter and prescription medicines only as told by your health care provider. If you were prescribed antibiotics, use them as told by your provider. Do not stop using them even if you start to feel better. Wound care Clean your wound 1-2 times a day, or as told by your provider. Wash your hands for at least 20 seconds with mild soap and water. Do this before and after you clean your wound. If soap and water are not available, use hand sanitizer to clean your hands. Wash your wound using mild soap and water, a wound cleanser, or a saltwater solution. A saltwater solution is also called saline. Do  not use hydrogen peroxide or alcohol. These can slow healing. Rinse off the soap. Pat your wound dry with a clean towel. Do not rub your wound. Put an antibiotic ointment on your wound as told by your provider. You may also be told to put on a jelly that prevents moisture from entering the wound. Cover your wound with a bandage as told by your provider. Small or very minor abrasions may not need a bandage. Keep your bandage clean and dry as told by your provider. There are many ways to close and cover a wound. Follow instructions from your provider about changing and removing your bandage. You may have to change  your bandage one or more times a day, or as told by your provider. Check your wound every day for signs of infection. Check for: Redness. Watch for a red streak that spreads out from your wound. Swelling or worse pain. Warmth. Blood, fluid, pus, or a bad smell. Managing pain and swelling  If told, put ice on the injured area. Put ice in a plastic bag. Place a towel between your skin and the bag. Leave the ice on for 20 minutes, 2-3 times a day. If your skin turns bright red, remove the ice right away to prevent skin damage. The risk of damage is higher if you cannot feel pain, heat, or cold. If possible, raise the injured area above the level of your heart while you are sitting or lying down. General instructions Do not take baths, swim, or use a hot tub until your provider approves. Ask your provider if you may take showers. You may only be allowed to take sponge baths. Do not scratch or pick at scabs that may occur over the wound as it heals. Contact a health care provider if: You got a tetanus shot, and you have swelling, severe pain, redness, or bleeding at the site of your shot. Your pain is worse and medicines do not help. You have a fever. You have any of signs of infection. Get help right away if: You have a red streak spreading away from your wound. This information is  not intended to replace advice given to you by your health care provider. Make sure you discuss any questions you have with your health care provider. Document Revised: 01/28/2023 Document Reviewed: 01/28/2023 Elsevier Patient Education  2024 [employer].

## 2024-05-28 NOTE — Progress Notes (Unsigned)
 Addendum. F/U to see NP for swelling at the joint of her right hand after injuring at a grocery store.

## 2024-07-01 ENCOUNTER — Encounter: Payer: Self-pay | Admitting: Family Medicine

## 2024-07-19 ENCOUNTER — Telehealth: Payer: Self-pay

## 2024-07-20 ENCOUNTER — Encounter: Payer: Self-pay | Admitting: Registered Nurse

## 2024-07-20 ENCOUNTER — Telehealth: Payer: Self-pay | Admitting: Registered Nurse

## 2024-07-20 DIAGNOSIS — R197 Diarrhea, unspecified: Secondary | ICD-10-CM

## 2024-07-20 NOTE — Telephone Encounter (Signed)
 See RN Apolinar note dated 07/19/24 She notified NP patient instructed on oral rehydration and advancing diet as tolerated.  To stay home 07/19/24 and diarrhea must be resolved x 24 hours for return to work and no new symptoms.  Patient with diarrhea met excused absence per communicable disease policy  HR and supervisor team notified.  Will re-evaluate 07/20/24  Exitcare handouts of viral gastroenteritis, food poisoning and foods to relieve diarrhea sent to patient my chart.

## 2024-07-23 NOTE — Telephone Encounter (Signed)
 Patient reported she returned to work today and symptoms improved.  Denied further questions or concerns. HR team notified cleared to be onsite today.

## 2024-07-26 NOTE — Telephone Encounter (Signed)
 Patient reported feeling better each day able to perform duties and had no further questions at this time.  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

## 2024-08-25 ENCOUNTER — Encounter: Payer: Self-pay | Admitting: Registered Nurse

## 2024-08-25 ENCOUNTER — Telehealth: Payer: Self-pay | Admitting: Registered Nurse

## 2024-08-25 DIAGNOSIS — A084 Viral intestinal infection, unspecified: Secondary | ICD-10-CM

## 2024-08-29 NOTE — Telephone Encounter (Signed)
 Patient reported she had GI upset Friday that resolved over the weekend.  She also had headache that was not like her typical migraine on Friday which has also since resolved.  Denied blood in stools or emesis, fever/chills.  Returned to work yesterday without difficulty.  Denied new symptoms.  Discussed with patient that viral gastroenteritis has been circulating in community along with covid and other viruses.  HR and supervisor notified that her absence fell under communicable disease policy excuse for Friday 24 Aug 2024.  Patient A&Ox3 seen in workcenter today skin warm dry and pink respirations even and unlabored gait sure and steady no cough/congestion/throat clearing audible or noted.  Patient denied n/v/d/f/c in past 24 hours. Discussed continue to hydrate this week to replace lost fluids and electrolytes from last week. Patient notified email would be sent to HR and supervisor.  Patient stated she had had a stressful week last week also.  Denied any further needs from clinic staff at this time.  Patient verbalized understanding information/instructions and had no further questions at this time.

## 2024-09-17 ENCOUNTER — Ambulatory Visit: Admitting: General Practice

## 2024-09-17 MED ORDER — COVID-19 MRNA VAC-TRIS(PFIZER) 30 MCG/0.3ML IM SUSY: 0.3000 mL | PREFILLED_SYRINGE | Freq: Once | INTRAMUSCULAR | 0 refills | Status: DC

## 2024-09-18 ENCOUNTER — Telehealth: Payer: Self-pay | Admitting: Registered Nurse

## 2024-09-18 ENCOUNTER — Encounter: Payer: Self-pay | Admitting: Registered Nurse

## 2024-09-18 DIAGNOSIS — R202 Paresthesia of skin: Secondary | ICD-10-CM

## 2024-09-18 DIAGNOSIS — M792 Neuralgia and neuritis, unspecified: Secondary | ICD-10-CM

## 2024-09-18 NOTE — Telephone Encounter (Signed)
 Patient to clinic for questions noted denied loss of bowel/bladder control, saddle paresthesias or arm/leg weakness  Denied n/v/d/f/c.  Left 2nd and third finger with tingling and numbness started at work today.  Working in [employer]  Right hand dominant working on table with mostly left hand picking up items.  Denied injury/rash/bruising/swelling.  Denied new exercise program or hobbies.  DTRS 2+/4 equal bilaterally brachioradialis and patellar; bilateral grip strength equal 5/5.  Skin warm dry and pink no ecchymosis/swelling/abrasion/laceration/rashes/scaling noted bilateral arms/neck/face/head.  C/T/L spine not TTP paraspinals.  Trapezius muscles bilaterally tight applied thermacare neck patch from clinic stock. Home stretches demonstrated to patient-e.g. Arm circles, walking up wall, chest stretches, neck AROM, chin tucks, knee to chest and rock side to side on back. Self massage or professional prn, foam roller use or tennis/racquetball.  Heat/cryotherapy 15 minutes QID prn.  Trial thermacare 1 applied Discussed changing which arm lifting items and to rotate not use same arm all shift.  Consider physical therapy referral if no improvement with prescribed therapy from [facility] and/or chiropractic care.  Ensure ergonomics correct desk at work avoid repetitive motions if possible/holding phone/laptop in hand use desk/stand and/or break up lifting items into smaller loads/weights.  Patient was instructed to rest, ice, and ROM exercises.  Activity as tolerated.   Follow up if symptoms persist or worsen especially if loss of bowel/bladder control, arm/leg weakness and/or saddle paresthesias.  Exitcare handout on muscle spasms and cervical strain rehab exercises given to patient.  Consider prednisone taper 10mg  40x2/30x2days/20mg x2 days/10mg x2 days if no improvement over next 48 hours.  Discussed with patient most likely pinched nerve from muscle spasms she is to try  nsaid/tylenol /epsom salt bath/stretches initially along with alternating heat/ice.    Patient verbalized agreement and understanding of treatment plan and had no further questions at this time.  P2:  Injury Prevention and Fitness.

## 2024-10-09 NOTE — Telephone Encounter (Signed)
 Patient reported arm pain has resolved but started to have right scapula/muscle pain this weekend at home.  Discussed eagle arm stretches and chest/pectoralis stretches and demonstrated to patient.  Avoid holding phone and reading for long periods or holding items far from body as typically strains the muscle that she is having pain with at this time.  Bilateral hand strength 5/5 AROM equal bilaterally.  May trial massage/heat/ice also.  Patient agreed with plan of care and had no further questions at this time.

## 2024-10-17 ENCOUNTER — Encounter: Payer: Self-pay | Admitting: Registered Nurse

## 2024-10-17 ENCOUNTER — Telehealth: Payer: Self-pay | Admitting: Registered Nurse

## 2024-10-17 ENCOUNTER — Other Ambulatory Visit: Payer: Self-pay | Admitting: General Practice

## 2024-10-17 ENCOUNTER — Ambulatory Visit: Admitting: General Practice

## 2024-10-17 VITALS — BP 144/84 | HR 48 | Temp 99.0°F | Resp 18

## 2024-10-17 DIAGNOSIS — J069 Acute upper respiratory infection, unspecified: Secondary | ICD-10-CM

## 2024-10-17 DIAGNOSIS — R0981 Nasal congestion: Secondary | ICD-10-CM

## 2024-10-17 NOTE — Progress Notes (Signed)
 Employee presents to the clinic this am, communicated via email she was out sick yesterday and still has some congestion. Reports not taken any medications for the congestion and has been around sick contacts this past weekend. States probably haven't been drinking enough liquids, had soup over the weekend. C/O sinus pressure and post nasal drip, denies any coughing or yellow mucus blown from nose and pain. States feels much better today than the last 3 days.  See VS. RN performed a POCT COVID-19 Ag card test, results negative. B/P slightly elevated and low pulse. Encouraged her to drink more water all day and decaffeinated drinks to stay hydrated. Provided 10 mg Loratadine , once today to assist with congestion. Also encouraged to eat more than soup to continue gaining strength. Will follow up as needed and appropriate.

## 2024-10-17 NOTE — Telephone Encounter (Signed)
 Patient notified to see RN Karene for evaluation today  that she is onsite 8a-5p today.  Patient reported cold symptoms yesterday.  If still having rhinitis/covid like symptoms will covid test and VS today ensure no fever.  NP onsite tomorrow 11a-2p.  Patient called out sick stayed home yesterday

## 2024-10-17 NOTE — Telephone Encounter (Signed)
 See tcon dated 10/17/24

## 2024-10-18 NOTE — Telephone Encounter (Signed)
 Patient reported all symptoms resolved feeling much better.  Had diarrhea and chills and was told by RN Karene had mild fever when seen in clinic.  HR and supervisor notified excused absence for 10/16/24 communicable disease policy.  Patient verbalized understanding information and had no further questions at this time.

## 2024-10-23 ENCOUNTER — Ambulatory Visit: Payer: Self-pay | Admitting: Registered Nurse

## 2024-10-23 DIAGNOSIS — M792 Neuralgia and neuritis, unspecified: Secondary | ICD-10-CM

## 2024-10-23 DIAGNOSIS — R0981 Nasal congestion: Secondary | ICD-10-CM

## 2024-10-23 LAB — POC COVID19 BINAXNOW: SARS Coronavirus 2 Ag: NEGATIVE

## 2024-10-30 NOTE — Telephone Encounter (Signed)
 Patient reported pain resolving shoulder/neck/back and URI symptoms resolved denied further concerns or questions at this time.  A&Ox3 spoke full sentences without difficulty gait sure and steady in workcenter skin warm dry and pink; no audible cough/congestion/throat clearing

## 2024-11-21 ENCOUNTER — Ambulatory Visit: Admitting: General Practice

## 2024-11-21 VITALS — BP 144/82 | HR 54 | Temp 97.0°F | Resp 18

## 2024-11-21 DIAGNOSIS — G43019 Migraine without aura, intractable, without status migrainosus: Secondary | ICD-10-CM

## 2024-11-21 NOTE — Progress Notes (Signed)
 Sonya Trujillo presents to the clinic this am with c/o a migraine with dizziness. She took Ibuprofen  800 mg for her migraine and rested most of yesterday. She has not taken any medication today and states the migraine is not as bad as yesterday but still bothersome. Her current workstation involves climbing up and down ladders and she feels unsteady. She has had a snack for breakfast. She denies any nausea, vomiting or diarrhea, or sensitivity to lights or sounds. She denies the room spinning or any visual problems.  Upon assessment, Sonya Trujillo is pale in skin tone, does not appear to have an unsteady gait. See VS. RN provided Ibuprofen  400 mg X 2 packets and advised her not to take any of the medication until she has eaten a meal or drank a glass of milk. RN is unable to approve her working and climbing on ladders with continued dizziness. RN advised her to return home to rest with the lights out, TV off, telephone on vibrate and continue to hydrate with water during the day. Provided her with the clinic contact phone number and email if needed later today or tomorrow am.

## 2024-11-22 ENCOUNTER — Telehealth: Payer: Self-pay | Admitting: Registered Nurse

## 2024-11-22 DIAGNOSIS — R42 Dizziness and giddiness: Secondary | ICD-10-CM

## 2024-11-22 NOTE — Telephone Encounter (Signed)
 Per RN Karene patient with dizziness yesterday and mildly elevated BP left work yesterday to go home and rest.  Attempted to follow up with patient today and she had called out sick again today.  Left voicemail for patient to contact clinic staff via my chart, [email or web address] if further concerns or needs as clinic closed Friday-Sunday

## 2024-11-25 NOTE — Telephone Encounter (Signed)
 Spoke to patient via telephone stated symptoms resolved Thursday and worked Friday and Saturday without difficulty.  Denied further questions or concerns at this time.  A&Ox3 spoke full sentences without difficulty  No audible cough/congestion/throat clearing during 2 minute call.
